# Patient Record
Sex: Female | Born: 1990
Health system: Southern US, Community
[De-identification: ages and names within clinical notes are randomized; demographics above are authoritative.]

## PROBLEM LIST (undated history)

## (undated) DIAGNOSIS — B009 Herpesviral infection, unspecified: Secondary | ICD-10-CM

## (undated) DIAGNOSIS — J45909 Unspecified asthma, uncomplicated: Secondary | ICD-10-CM

## (undated) DIAGNOSIS — E669 Obesity, unspecified: Secondary | ICD-10-CM

## (undated) HISTORY — DX: Herpesviral infection, unspecified: B00.9

## (undated) HISTORY — PX: WISDOM TOOTH EXTRACTION: SHX21

---
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
## 2009-06-05 ENCOUNTER — Emergency Department (HOSPITAL_COMMUNITY): Admission: EM | Admit: 2009-06-05 | Discharge: 2009-06-05 | Payer: Self-pay | Admitting: Family Medicine

## 2010-01-11 ENCOUNTER — Emergency Department (HOSPITAL_COMMUNITY): Admission: EM | Admit: 2010-01-11 | Discharge: 2010-01-11 | Payer: Self-pay | Admitting: Emergency Medicine

## 2010-04-01 ENCOUNTER — Emergency Department (HOSPITAL_COMMUNITY): Admission: EM | Admit: 2010-04-01 | Discharge: 2010-04-01 | Payer: Self-pay | Admitting: Emergency Medicine

## 2010-04-07 ENCOUNTER — Inpatient Hospital Stay (HOSPITAL_COMMUNITY): Admission: AD | Admit: 2010-04-07 | Discharge: 2010-04-07 | Payer: Self-pay | Admitting: Obstetrics and Gynecology

## 2010-04-07 ENCOUNTER — Ambulatory Visit: Payer: Self-pay | Admitting: Obstetrics and Gynecology

## 2010-04-16 ENCOUNTER — Emergency Department (HOSPITAL_COMMUNITY): Admission: EM | Admit: 2010-04-16 | Discharge: 2010-04-17 | Payer: Self-pay | Admitting: Emergency Medicine

## 2010-09-23 LAB — URINALYSIS, ROUTINE W REFLEX MICROSCOPIC
Hgb urine dipstick: NEGATIVE
Nitrite: NEGATIVE
Specific Gravity, Urine: 1.027 (ref 1.005–1.030)
Urobilinogen, UA: 1 mg/dL (ref 0.0–1.0)
pH: 8 (ref 5.0–8.0)

## 2010-09-23 LAB — POCT PREGNANCY, URINE
Preg Test, Ur: NEGATIVE
Preg Test, Ur: NEGATIVE

## 2010-09-23 LAB — BASIC METABOLIC PANEL
BUN: 16 mg/dL (ref 6–23)
CO2: 29 mEq/L (ref 19–32)
Calcium: 8.9 mg/dL (ref 8.4–10.5)
Chloride: 105 mEq/L (ref 96–112)
Creatinine, Ser: 0.64 mg/dL (ref 0.4–1.2)
Glucose, Bld: 103 mg/dL — ABNORMAL HIGH (ref 70–99)

## 2010-09-23 LAB — CBC
MCH: 27.8 pg (ref 26.0–34.0)
MCHC: 32.7 g/dL (ref 30.0–36.0)
MCV: 85.1 fL (ref 78.0–100.0)
Platelets: 238 10*3/uL (ref 150–400)
RBC: 4.25 MIL/uL (ref 3.87–5.11)
RDW: 15.3 % (ref 11.5–15.5)

## 2010-09-23 LAB — DIFFERENTIAL
Basophils Relative: 0 % (ref 0–1)
Eosinophils Absolute: 0.1 10*3/uL (ref 0.0–0.7)
Eosinophils Relative: 1 % (ref 0–5)
Lymphs Abs: 1.8 10*3/uL (ref 0.7–4.0)
Neutrophils Relative %: 71 % (ref 43–77)

## 2010-09-23 LAB — WET PREP, GENITAL: WBC, Wet Prep HPF POC: NONE SEEN

## 2010-09-23 LAB — POCT I-STAT, CHEM 8
Calcium, Ion: 1.25 mmol/L (ref 1.12–1.32)
Creatinine, Ser: 0.7 mg/dL (ref 0.4–1.2)
Glucose, Bld: 95 mg/dL (ref 70–99)
Hemoglobin: 12.2 g/dL (ref 12.0–15.0)
Potassium: 4.1 mEq/L (ref 3.5–5.1)

## 2010-09-23 LAB — GC/CHLAMYDIA PROBE AMP, GENITAL: GC Probe Amp, Genital: NEGATIVE

## 2011-01-03 ENCOUNTER — Inpatient Hospital Stay (HOSPITAL_COMMUNITY): Payer: PRIVATE HEALTH INSURANCE

## 2011-01-03 ENCOUNTER — Inpatient Hospital Stay (HOSPITAL_COMMUNITY)
Admission: AD | Admit: 2011-01-03 | Discharge: 2011-01-03 | Disposition: A | Discharge status: Home / Self Care | Payer: PRIVATE HEALTH INSURANCE | Source: Ambulatory Visit | Attending: Obstetrics & Gynecology | Admitting: Obstetrics & Gynecology

## 2011-01-03 DIAGNOSIS — R109 Unspecified abdominal pain: Secondary | ICD-10-CM

## 2011-01-03 DIAGNOSIS — N949 Unspecified condition associated with female genital organs and menstrual cycle: Secondary | ICD-10-CM | POA: Insufficient documentation

## 2011-01-03 LAB — CBC
MCH: 26.9 pg (ref 26.0–34.0)
MCV: 83.5 fL (ref 78.0–100.0)
Platelets: 197 10*3/uL (ref 150–400)
RDW: 14 % (ref 11.5–15.5)

## 2011-07-09 ENCOUNTER — Ambulatory Visit (INDEPENDENT_AMBULATORY_CARE_PROVIDER_SITE_OTHER): Payer: PRIVATE HEALTH INSURANCE

## 2011-07-09 DIAGNOSIS — R197 Diarrhea, unspecified: Secondary | ICD-10-CM

## 2011-07-09 DIAGNOSIS — R112 Nausea with vomiting, unspecified: Secondary | ICD-10-CM

## 2011-10-09 ENCOUNTER — Emergency Department (HOSPITAL_COMMUNITY)
Admission: EM | Admit: 2011-10-09 | Discharge: 2011-10-09 | Disposition: A | Discharge status: Home / Self Care | Payer: PRIVATE HEALTH INSURANCE | Attending: Emergency Medicine | Admitting: Emergency Medicine

## 2011-10-09 ENCOUNTER — Emergency Department (HOSPITAL_COMMUNITY): Payer: PRIVATE HEALTH INSURANCE

## 2011-10-09 ENCOUNTER — Encounter (HOSPITAL_COMMUNITY): Payer: Self-pay | Admitting: Emergency Medicine

## 2011-10-09 DIAGNOSIS — J40 Bronchitis, not specified as acute or chronic: Secondary | ICD-10-CM

## 2011-10-09 DIAGNOSIS — F17200 Nicotine dependence, unspecified, uncomplicated: Secondary | ICD-10-CM | POA: Insufficient documentation

## 2011-10-09 MED ORDER — ALBUTEROL SULFATE HFA 108 (90 BASE) MCG/ACT IN AERS
1.0000 | INHALATION_SPRAY | Freq: Once | RESPIRATORY_TRACT | Status: AC
  Administered 2011-10-09: 1 via RESPIRATORY_TRACT
  Filled 2011-10-09: qty 6.7

## 2011-10-09 MED ORDER — ALBUTEROL SULFATE (5 MG/ML) 0.5% IN NEBU
5.0000 mg | INHALATION_SOLUTION | Freq: Once | RESPIRATORY_TRACT | Status: AC
  Administered 2011-10-09: 5 mg via RESPIRATORY_TRACT
  Filled 2011-10-09: qty 1

## 2011-10-09 MED ORDER — ALBUTEROL SULFATE (5 MG/ML) 0.5% IN NEBU
5.0000 mg | INHALATION_SOLUTION | Freq: Once | RESPIRATORY_TRACT | Status: AC
  Administered 2011-10-09: 5 mg via RESPIRATORY_TRACT
  Filled 2011-10-09 (×2): qty 0.5

## 2011-10-09 MED ORDER — HYDROCOD POLST-CHLORPHEN POLST 10-8 MG/5ML PO LQCR: 5.0000 mL | Freq: Two times a day (BID) | ORAL | Status: DC | PRN

## 2011-10-09 MED ORDER — IPRATROPIUM BROMIDE 0.02 % IN SOLN
0.5000 mg | Freq: Once | RESPIRATORY_TRACT | Status: AC
  Administered 2011-10-09: 0.5 mg via RESPIRATORY_TRACT
  Filled 2011-10-09: qty 2.5

## 2011-10-09 NOTE — ED Provider Notes (Signed)
History     CSN: [id number]  Arrival date & time 10/09/11  8295   First MD Initiated Contact with Patient 10/09/11 1945      Chief Complaint  Patient presents with  . Cough    (Consider location/radiation/quality/duration/timing/severity/associated sxs/prior treatment) HPI History provided by pt.   Pt has had a non-productive cough for the past month.  Became more frequent over the past week and now associated w/ diffuse, constant CP that is aggravated by coughing.  Occasionally has SOB and had one episode of vomiting following coughing fits.  Denies fever but has had nasal congestion and pain in throat when coughs.  Has been taking OTC anti-tussives w/out relief.  Smokes 3 cigarettes a day.  No RF for PE.    History reviewed. No pertinent past medical history.  History reviewed. No pertinent past surgical history.  History reviewed. No pertinent family history.  History  Substance Use Topics  . Smoking status: Current Some Day Smoker  . Smokeless tobacco: Not on file  . Alcohol Use: Yes     occasionally    OB History    Grav Para Term Preterm Abortions TAB SAB Ect Mult Living                  Review of Systems  All other systems reviewed and are negative.    Allergies  Review of patient's allergies indicates no known allergies.  Home Medications  No current outpatient prescriptions on file.  BP 131/92  Pulse 100  Temp(Src) 98.8 F (37.1 C) (Oral)  Resp 20  Wt 307 lb (139.254 kg)  SpO2 100%  LMP 09/20/2011  Physical Exam  Nursing note and vitals reviewed. Constitutional: She is oriented to person, place, and time. She appears well-developed and well-nourished. No distress.  HENT:  Head: Normocephalic and atraumatic.  Eyes:       Normal appearance  Neck: Normal range of motion.  Cardiovascular: Normal rate, regular rhythm and intact distal pulses.   Pulmonary/Chest: Effort normal and breath sounds normal. No respiratory distress.       coughing    Abdominal: Soft. Bowel sounds are normal. She exhibits no distension. There is no tenderness. There is no guarding.  Musculoskeletal:       No peripheral edema or calf tenderness  Neurological: She is alert and oriented to person, place, and time.  Skin: Skin is warm and dry. No rash noted.  Psychiatric: She has a normal mood and affect. Her behavior is normal.    ED Course  Procedures (including critical care time)  Labs Reviewed - No data to display Dg Chest 2 View  10/09/2011  *RADIOLOGY REPORT*  Clinical Data: Chest pain.  Short of breath.  Cough.  CHEST - 2 VIEW  Comparison: None.  Findings: Mild elevation of the left hemidiaphragm. Cardiopericardial silhouette within normal limits. Mediastinal contours normal. Trachea midline.  No airspace disease or effusion.  IMPRESSION: No acute cardiopulmonary disease.  Original Report Authenticated By: Andreas Newport, M.D.     1. Bronchitis       MDM  Healthy 21yo F presents w/ cough x 1 month that has worsened over past week.   On exam, afebrile, no respiratory distress, coughing, lungs clear.  Pt received a breathing treatment w/ subjective improvement of sx but feels as though they are worsening again.  Will try one more.  CXR neg for pneumonia.  Pt d/c'd home w/ albutero inhaler and tussionex for symptomatic tmnt of bronchitis.  Recommended PCP  f/u if no improvement by end of week and cutting back on cigarettes.  Return precautions discussed.         Arie Sabina [place], [place] 10/09/11 2103

## 2011-10-09 NOTE — ED Notes (Signed)
Patient transported to X-ray 

## 2011-10-09 NOTE — Discharge Instructions (Signed)
Take tussionex at night for cough.  Do not drive within four hours of taking this medication (may cause drowsiness or confusion).  Use albuterol inhaler, 2 puffs every 4 hours, as needed for cough and shortness of breath.   Follow up with your primary care doctor if your symptoms have not started to improve by the end of the week.  Try to cut back on smoking because this may prolong your symptoms.  You should return to the ER if you develop difficulty breathing.

## 2011-10-09 NOTE — ED Notes (Signed)
Pt c/o cough x 1 month, has seen PCP who told her she had a cold.  Pt has been taking OTC cough and cold medications w/o relief.

## 2011-10-09 NOTE — ED Provider Notes (Signed)
Medical screening examination/treatment/procedure(s) were performed by non-physician practitioner and as supervising physician I was immediately available for consultation/collaboration.   Dayton Bailiff, MD 10/09/11 2320

## 2011-12-21 ENCOUNTER — Emergency Department (HOSPITAL_COMMUNITY)
Admission: EM | Admit: 2011-12-21 | Discharge: 2011-12-21 | Disposition: A | Discharge status: Home / Self Care | Payer: PRIVATE HEALTH INSURANCE | Attending: Emergency Medicine | Admitting: Emergency Medicine

## 2011-12-21 ENCOUNTER — Encounter (HOSPITAL_COMMUNITY): Payer: Self-pay | Admitting: *Deleted

## 2011-12-21 DIAGNOSIS — J209 Acute bronchitis, unspecified: Secondary | ICD-10-CM | POA: Insufficient documentation

## 2011-12-21 DIAGNOSIS — F17200 Nicotine dependence, unspecified, uncomplicated: Secondary | ICD-10-CM | POA: Insufficient documentation

## 2011-12-21 HISTORY — DX: Unspecified asthma, uncomplicated: J45.909

## 2011-12-21 MED ORDER — HYDROCODONE-ACETAMINOPHEN 7.5-500 MG/15ML PO SOLN: 10.0000 mL | Freq: Four times a day (QID) | ORAL | Status: AC | PRN

## 2011-12-21 MED ORDER — AZITHROMYCIN 500 MG PO TABS: 500.0000 mg | ORAL_TABLET | Freq: Every day | ORAL | Status: AC

## 2011-12-21 NOTE — Discharge Instructions (Signed)
Continue to use your inhaler and motrin. Rest and push fluids. Return for worsening of symptoms  Acute Bronchitis You have acute bronchitis. This means you have a chest cold. The airways in your lungs are red and sore (inflamed). Acute means it is sudden onset.  CAUSES Bronchitis is most often caused by the same virus that causes a cold. SYMPTOMS   Body aches.   Chest congestion.   Chills.   Cough.   Fever.   Shortness of breath.   Sore throat.  TREATMENT  Acute bronchitis is usually treated with rest, fluids, and medicines for relief of fever or cough. Most symptoms should go away after a few days or a week. Increased fluids may help thin your secretions and will prevent dehydration. Your caregiver may give you an inhaler to improve your symptoms. The inhaler reduces shortness of breath and helps control cough. You can take over-the-counter pain relievers or cough medicine to decrease coughing, pain, or fever. A cool-air vaporizer may help thin bronchial secretions and make it easier to clear your chest. Antibiotics are usually not needed but can be prescribed if you smoke, are seriously ill, have chronic lung problems, are elderly, or you are at higher risk for developing complications.Allergies and asthma can make bronchitis worse. Repeated episodes of bronchitis may cause longstanding lung problems. Avoid smoking and secondhand smoke.Exposure to cigarette smoke or irritating chemicals will make bronchitis worse. If you are a cigarette smoker, consider using nicotine gum or skin patches to help control withdrawal symptoms. Quitting smoking will help your lungs heal faster. Recovery from bronchitis is often slow, but you should start feeling better after 2 to 3 days. Cough from bronchitis frequently lasts for 3 to 4 weeks. To prevent another bout of acute bronchitis:  Quit smoking.   Wash your hands frequently to get rid of viruses or use a hand sanitizer.   Avoid other people  with cold or virus symptoms.   Try not to touch your hands to your mouth, nose, or eyes.  SEEK IMMEDIATE MEDICAL CARE IF:  You develop increased fever, chills, or chest pain.   You have severe shortness of breath or bloody sputum.   You develop dehydration, fainting, repeated vomiting, or a severe headache.   You have no improvement after 1 week of treatment or you get worse.  MAKE SURE YOU:   Understand these instructions.   Will watch your condition.   Will get help right away if you are not doing well or get worse.  Document Released: 08/04/2004 Document Revised: 06/16/2011 Document Reviewed: 10/20/2010 [facility] Patient Information 2012 [place], [place].

## 2011-12-21 NOTE — ED Notes (Signed)
Pt presents with cough, reproducible chest discomfort with cough and movement, states when she does bring up secretions they sometimes have blood tinged color.No history of any cardiac problems, constantly coughing in triage, dry cough

## 2011-12-21 NOTE — ED Provider Notes (Signed)
Medical screening examination/treatment/procedure(s) were performed by non-physician practitioner and as supervising physician I was immediately available for consultation/collaboration.   Lyanne Co, MD 12/21/11 1122

## 2011-12-21 NOTE — ED Provider Notes (Signed)
History     CSN: [id number]  Arrival date & time 12/21/11  1030   First MD Initiated Contact with Patient 12/21/11 1044      Chief Complaint  Patient presents with  . Cough    (Consider location/radiation/quality/duration/timing/severity/associated sxs/prior treatment) Patient is a 21 y.o. female presenting with cough. The history is provided by the patient.  Cough The current episode started 2 days ago. The problem occurs constantly. The problem has not changed since onset.There has been no fever. Pertinent negatives include no rhinorrhea and no shortness of breath. Associated symptoms comments: 21 y/o female INAD c/o occasionally productive cough x 2-3 days with back back exacerbated by cough. Denies fever, SOB  Or GI complaints. Pt has noticed a scant amount of blood tinged sputum. She has tried nothing for the symptoms.    Past Medical History  Diagnosis Date  . Asthma     History reviewed. No pertinent past surgical history.  No family history on file.  History  Substance Use Topics  . Smoking status: Current Some Day Smoker  . Smokeless tobacco: Not on file  . Alcohol Use: Yes     occasionally    OB History    Grav Para Term Preterm Abortions TAB SAB Ect Mult Living                  Review of Systems  Constitutional: Negative.  Negative for fever.  HENT: Negative for congestion and rhinorrhea.   Respiratory: Positive for cough. Negative for chest tightness and shortness of breath.   Cardiovascular: Negative.   Gastrointestinal: Negative.   Musculoskeletal: Positive for back pain.  Skin: Negative.     Allergies  Review of patient's allergies indicates no known allergies.  Home Medications   Current Outpatient Rx  Name Route Sig Dispense Refill  . HYDROCOD POLST-CPM POLST ER 10-8 MG/5ML PO LQCR Oral Take 5 mLs by mouth every 12 (twelve) hours as needed. 60 mL 0    BP 141/63  Pulse 83  Temp 98.2 F (36.8 C)  Resp 22  SpO2 97%  LMP  12/14/2011  Physical Exam  Constitutional: She is oriented to person, place, and time. She appears well-developed and well-nourished. No distress.  HENT:  Head: Normocephalic and atraumatic.  Eyes: Conjunctivae and EOM are normal.  Cardiovascular: Normal rate.   Pulmonary/Chest: Effort normal and breath sounds normal. No respiratory distress. She has no wheezes. She has no rales. She exhibits no tenderness.  Musculoskeletal: Normal range of motion.  Neurological: She is alert and oriented to person, place, and time.  Psychiatric: She has a normal mood and affect.    ED Course  Procedures (including critical care time)  Labs Reviewed - No data to display No results found.   No diagnosis found.  Acute bronchitis  MDM  21 y/o female iNAD c/o intermittently productive cough. Will Tx with azithromycin, cough suppressants and motrin for myalgia exacerbated by cough        Wynetta Emery, PA-C 12/21/11 1102

## 2012-07-24 ENCOUNTER — Encounter (HOSPITAL_COMMUNITY): Payer: Self-pay | Admitting: *Deleted

## 2012-07-24 ENCOUNTER — Emergency Department (HOSPITAL_COMMUNITY)
Admission: EM | Admit: 2012-07-24 | Discharge: 2012-07-24 | Disposition: A | Discharge status: Home / Self Care | Payer: Self-pay | Attending: Emergency Medicine | Admitting: Emergency Medicine

## 2012-07-24 DIAGNOSIS — R52 Pain, unspecified: Secondary | ICD-10-CM | POA: Insufficient documentation

## 2012-07-24 DIAGNOSIS — J45909 Unspecified asthma, uncomplicated: Secondary | ICD-10-CM | POA: Insufficient documentation

## 2012-07-24 DIAGNOSIS — F17200 Nicotine dependence, unspecified, uncomplicated: Secondary | ICD-10-CM | POA: Insufficient documentation

## 2012-07-24 DIAGNOSIS — J029 Acute pharyngitis, unspecified: Secondary | ICD-10-CM | POA: Insufficient documentation

## 2012-07-24 DIAGNOSIS — Z79899 Other long term (current) drug therapy: Secondary | ICD-10-CM | POA: Insufficient documentation

## 2012-07-24 DIAGNOSIS — M542 Cervicalgia: Secondary | ICD-10-CM | POA: Insufficient documentation

## 2012-07-24 DIAGNOSIS — R51 Headache: Secondary | ICD-10-CM | POA: Insufficient documentation

## 2012-07-24 DIAGNOSIS — R197 Diarrhea, unspecified: Secondary | ICD-10-CM | POA: Insufficient documentation

## 2012-07-24 DIAGNOSIS — R6889 Other general symptoms and signs: Secondary | ICD-10-CM

## 2012-07-24 DIAGNOSIS — R509 Fever, unspecified: Secondary | ICD-10-CM | POA: Insufficient documentation

## 2012-07-24 LAB — CBC WITH DIFFERENTIAL/PLATELET
Basophils Absolute: 0 10*3/uL (ref 0.0–0.1)
Basophils Relative: 0 % (ref 0–1)
MCHC: 32.4 g/dL (ref 30.0–36.0)
Monocytes Absolute: 1.2 10*3/uL — ABNORMAL HIGH (ref 0.1–1.0)
Neutro Abs: 5.7 10*3/uL (ref 1.7–7.7)
Neutrophils Relative %: 63 % (ref 43–77)
Platelets: 226 10*3/uL (ref 150–400)
RDW: 13.7 % (ref 11.5–15.5)

## 2012-07-24 LAB — COMPREHENSIVE METABOLIC PANEL
ALT: 12 U/L (ref 0–35)
AST: 30 U/L (ref 0–37)
CO2: 26 mEq/L (ref 19–32)
Calcium: 8.8 mg/dL (ref 8.4–10.5)
Creatinine, Ser: 0.6 mg/dL (ref 0.50–1.10)
GFR calc Af Amer: >90 mL/min (ref >90)
GFR calc non Af Amer: >90 mL/min (ref >90)
Sodium: 136 mEq/L (ref 135–145)
Total Protein: 7.9 g/dL (ref 6.0–8.3)

## 2012-07-24 LAB — URINALYSIS, ROUTINE W REFLEX MICROSCOPIC
Glucose, UA: NEGATIVE mg/dL
Leukocytes, UA: NEGATIVE
Protein, ur: NEGATIVE mg/dL
Urobilinogen, UA: 0.2 mg/dL (ref 0.0–1.0)

## 2012-07-24 MED ORDER — OSELTAMIVIR PHOSPHATE 75 MG PO CAPS: 75.0000 mg | ORAL_CAPSULE | Freq: Two times a day (BID) | ORAL | Status: DC

## 2012-07-24 MED ORDER — PROMETHAZINE HCL 25 MG/ML IJ SOLN
25.0000 mg | Freq: Once | INTRAMUSCULAR | Status: DC
  Filled 2012-07-24: qty 1

## 2012-07-24 MED ORDER — OXYCODONE-ACETAMINOPHEN 5-325 MG PO TABS
2.0000 | ORAL_TABLET | Freq: Once | ORAL | Status: AC
  Administered 2012-07-24: 2 via ORAL
  Filled 2012-07-24: qty 2

## 2012-07-24 MED ORDER — SODIUM CHLORIDE 0.9 % IV BOLUS (SEPSIS)
1000.0000 mL | Freq: Once | INTRAVENOUS | Status: AC
  Administered 2012-07-24: 1000 mL via INTRAVENOUS

## 2012-07-24 MED ORDER — HYDROCODONE-ACETAMINOPHEN 5-325 MG PO TABS: 2.0000 | ORAL_TABLET | Freq: Four times a day (QID) | ORAL | Status: DC | PRN

## 2012-07-24 MED ORDER — SODIUM CHLORIDE 0.9 % IV BOLUS (SEPSIS): 1000.0000 mL | Freq: Once | INTRAVENOUS | Status: DC

## 2012-07-24 MED ORDER — KETOROLAC TROMETHAMINE 30 MG/ML IJ SOLN
30.0000 mg | Freq: Once | INTRAMUSCULAR | Status: AC
  Administered 2012-07-24: 30 mg via INTRAVENOUS
  Filled 2012-07-24: qty 1

## 2012-07-24 MED ORDER — HYDROCODONE-ACETAMINOPHEN 5-325 MG PO TABS
1.0000 | ORAL_TABLET | Freq: Once | ORAL | Status: AC
  Administered 2012-07-24: 1 via ORAL
  Filled 2012-07-24: qty 1

## 2012-07-24 MED ORDER — PROMETHAZINE HCL 25 MG PO TABS
25.0000 mg | ORAL_TABLET | Freq: Once | ORAL | Status: AC
  Administered 2012-07-24: 25 mg via ORAL
  Filled 2012-07-24: qty 1

## 2012-07-24 NOTE — ED Provider Notes (Signed)
Patient seen, evaluated and treated in conjunction with the PA. Patient complained of headache and flulike symptoms. He was felt that this was likely a viral syndrome, but meningitis could not be ruled out. She did not have any meningismus, but in point of persistent headache worsening with moving her head. I have like a conversation with the patient about the possibility of meningitis and the need to rule out. She did consent to lumbar puncture procedure. Lumbar puncture was attempted, but patient did not tolerate the procedure because of pain. 2 attempts were made and the patient insisted that the procedure be terminated. I had a conversation with the patient and her mother about the possibility of meningitis and the danger of meningitis including protozoa and death. I also discussed possibility of lumbar puncture under fluoroscopy. Patient does not wish to pursue this at this time. She was therefore discharged and treated for influenza, given instructions to return for high fever, worsening headache, passing out or seizure.  Procedure note: Lumbar puncture Patient was placed in the sitting position. Back was cleaned with Betadine and sterile prep and drapes were applied. One percent lidocaine was utilized to infiltrate the area at the L4-L5 disc space. A 20-gauge spinal needle was then introduced through the anesthetized skin and advanced towards the disc space. I difficulty finding the intrer-disc space and took the needle out 2 times then reintroduced in attempt to find the inter-disc space. These attempts were unsuccessful. Patient then asked for the procedure to be terminated because she was uncomfortable. No CSF was obtained.  Gilda Crease, MD 07/24/12 2142

## 2012-07-24 NOTE — ED Notes (Signed)
MD at bedside. 

## 2012-07-24 NOTE — ED Provider Notes (Signed)
MSE was initiated and I personally evaluated the patient and placed orders (if any) at  3:58 PM on July 24, 2012.  22 year old female presents the emergency department complaining of fever, body aches, severe headache and neck pain beginning on Sunday. States anytime she moves her neck she gets severe headache and pain. Also complaining of cough and sore throat. Tmax 101.3 on Sunday. Patient had no tenderness to palpation of her neck, however she does have positive meningeal signs. Denies rash. She is in college but does not live in the dorms. I have slight suspicion for meningitis.  The patient appears stable so that the remainder of the MSE may be completed by another provider.  Trevor Mace, PA-C 07/24/12 [phone]

## 2012-07-24 NOTE — ED Notes (Addendum)
IV team attempted pt IV stick, was able to obtain blood for blood draw, however pt complained of pain when IV catheter was flushed.  IV nurse stated that in order to place IV in foot, an order would be needed from the Dr.  I have obtained a verbal order from [place], [place] to start IV in pts foot.  IV team notified regarding recent order and is en route.

## 2012-07-24 NOTE — ED Provider Notes (Signed)
Medical screening examination/treatment/procedure(s) were performed by non-physician practitioner and as supervising physician I was immediately available for consultation/collaboration.   Salli Bodin L Lenvil Swaim, MD 07/24/12 2348 

## 2012-07-24 NOTE — ED Notes (Signed)
PA @ bedside.

## 2012-07-24 NOTE — ED Provider Notes (Signed)
History     CSN: [id number]  Arrival date & time 07/24/12  1317   First MD Initiated Contact with Patient 07/24/12 1547      Chief Complaint  Patient presents with  . Generalized Body Aches  . Neck Pain  . Sore Throat  . Fever    (Consider location/radiation/quality/duration/timing/severity/associated sxs/prior treatment) HPI Comments: This is a 22 year old female, who presents to the emergency department with chief complaint of flulike symptoms. Patient states the symptoms began on Sunday. Is having fever, body aches, diarrhea, headache, and sore throat. She states that she measured a temperature of 101.3. She is afebrile at this time. She's been taking ibuprofen with some relief. Patient did not have a flu shot. She denies chest pain, shortness of breath, cough, nausea, vomiting, constipation, numbness and tingling of the extremities. Patient states that her symptoms have been persistent, and severe.  The history is provided by the patient. No language interpreter was used.    Past Medical History  Diagnosis Date  . Asthma     Past Surgical History  Procedure Date  . Wisdom tooth extraction     History reviewed. No pertinent family history.  History  Substance Use Topics  . Smoking status: Current Some Day Smoker  . Smokeless tobacco: Never Used  . Alcohol Use: Yes     Comment: occasionally    OB History    Grav Para Term Preterm Abortions TAB SAB Ect Mult Living                  Review of Systems  All other systems reviewed and are negative.    Allergies  Review of patient's allergies indicates no known allergies.  Home Medications   Current Outpatient Rx  Name  Route  Sig  Dispense  Refill  . DIPHENHYDRAMINE-APAP (SLEEP) 25-500 MG PO TABS   Oral   Take 1 tablet by mouth at bedtime as needed. Fever         . IBUPROFEN 200 MG PO TABS   Oral   Take 200 mg by mouth every 6 (six) hours as needed. Pain           BP 143/90  Pulse 83  Temp 98.7  F (37.1 C) (Oral)  Resp 18  SpO2 100%  LMP 06/27/2012  Physical Exam  Nursing note and vitals reviewed. Constitutional: She is oriented to person, place, and time. She appears well-developed and well-nourished.       Obese  HENT:  Head: Normocephalic and atraumatic.  Eyes: Conjunctivae normal and EOM are normal. Pupils are equal, round, and reactive to light.  Neck: Normal range of motion. Neck supple.       Tenderness to palpation of the occipital muscles, and associated tenderness with neck flexion, no signs of meningismus in the neck proper.  Cardiovascular: Normal rate and regular rhythm.  Exam reveals no gallop and no friction rub.   No murmur heard. Pulmonary/Chest: Effort normal and breath sounds normal. No respiratory distress. She has no wheezes. She has no rales. She exhibits no tenderness.  Abdominal: Soft. Bowel sounds are normal. She exhibits no distension and no mass. There is no tenderness. There is no rebound and no guarding.  Musculoskeletal: Normal range of motion. She exhibits no edema and no tenderness.       Negative Kernig's sign.  Neurological: She is alert and oriented to person, place, and time.  Skin: Skin is warm and dry.  Psychiatric: She has a normal mood and  affect. Her behavior is normal. Judgment and thought content normal.    ED Course  Procedures (including critical care time)   Labs Reviewed  CBC WITH DIFFERENTIAL  URINALYSIS, ROUTINE W REFLEX MICROSCOPIC  COMPREHENSIVE METABOLIC PANEL   Results for orders placed during the hospital encounter of 07/24/12  CBC WITH DIFFERENTIAL      Component Value Range   WBC 9.1  4.0 - 10.5 K/uL   RBC 4.42  3.87 - 5.11 MIL/uL   Hemoglobin 12.1  12.0 - 15.0 g/dL   HCT 16.1  09.6 - 04.5 %   MCV 84.6  78.0 - 100.0 fL   MCH 27.4  26.0 - 34.0 pg   MCHC 32.4  30.0 - 36.0 g/dL   RDW 40.9  81.1 - 91.4 %   Platelets 226  150 - 400 K/uL   Neutrophils Relative 63  43 - 77 %   Neutro Abs 5.7  1.7 - 7.7 K/uL    Lymphocytes Relative 22  12 - 46 %   Lymphs Abs 2.0  0.7 - 4.0 K/uL   Monocytes Relative 13 (*) 3 - 12 %   Monocytes Absolute 1.2 (*) 0.1 - 1.0 K/uL   Eosinophils Relative 2  0 - 5 %   Eosinophils Absolute 0.2  0.0 - 0.7 K/uL   Basophils Relative 0  0 - 1 %   Basophils Absolute 0.0  0.0 - 0.1 K/uL  URINALYSIS, ROUTINE W REFLEX MICROSCOPIC      Component Value Range   Color, Urine YELLOW  YELLOW   APPearance CLEAR  CLEAR   Specific Gravity, Urine 1.026  1.005 - 1.030   pH 6.5  5.0 - 8.0   Glucose, UA NEGATIVE  NEGATIVE mg/dL   Hgb urine dipstick NEGATIVE  NEGATIVE   Bilirubin Urine NEGATIVE  NEGATIVE   Ketones, ur TRACE (*) NEGATIVE mg/dL   Protein, ur NEGATIVE  NEGATIVE mg/dL   Urobilinogen, UA 0.2  0.0 - 1.0 mg/dL   Nitrite NEGATIVE  NEGATIVE   Leukocytes, UA NEGATIVE  NEGATIVE  COMPREHENSIVE METABOLIC PANEL      Component Value Range   Sodium 136  135 - 145 mEq/L   Potassium 4.4  3.5 - 5.1 mEq/L   Chloride 99  96 - 112 mEq/L   CO2 26  19 - 32 mEq/L   Glucose, Bld 77  70 - 99 mg/dL   BUN 6  6 - 23 mg/dL   Creatinine, Ser 7.82  0.50 - 1.10 mg/dL   Calcium 8.8  8.4 - 95.6 mg/dL   Total Protein 7.9  6.0 - 8.3 g/dL   Albumin 3.5  3.5 - 5.2 g/dL   AST 30  0 - 37 U/L   ALT 12  0 - 35 U/L   Alkaline Phosphatase 53  39 - 117 U/L   Total Bilirubin 0.1 (*) 0.3 - 1.2 mg/dL   GFR calc non Af Amer >90  >90 mL/min   GFR calc Af Amer >90  >90 mL/min       1. Flu-like symptoms       MDM  22 year old female with flu-like symptoms.  I suspect that this patient has the flu, however, she has been seen by Dr. Blinda Leatherwood, as I was slightly suspicious for meningitis.  Will obtain routine labs and give fluids.  Serial exams.  8:46 PM Lumbar Puncture performed by Dr. Blinda Leatherwood, but was unsuccessful 2/2 patient being unwilling to continue the procedure.  The risks of not  having the procedure completed were thoroughly explained to the patient.  She states that she wants to be treated for the  flu, and will return for worsening headache, fevers, or seizures.  It was explained that we would not be able to fully rule out meningitis, due to unsuccessful LP.  The patient understands the risks and agrees to follow-up if needed.  Currently, she is stable and ready for discharge.  I will treat the patient with Tamiflu, and have the patient follow-up if needed.  She understands and agrees with the plan.        Roxy Horseman, PA-C 07/24/12 2051

## 2012-07-24 NOTE — ED Notes (Signed)
Pt states Sunday started having a fever 101.3, body aches, headache, states took ibuprofen, then today started having neck pain, sore throat along with still having other symptoms.

## 2012-07-26 NOTE — ED Provider Notes (Signed)
Medical screening examination/treatment/procedure(s) were performed by non-physician practitioner and as supervising physician I was immediately available for consultation/collaboration.  Toy Baker, MD 07/26/12 [phone]

## 2013-07-06 ENCOUNTER — Emergency Department (HOSPITAL_COMMUNITY)
Admission: EM | Admit: 2013-07-06 | Discharge: 2013-07-06 | Disposition: A | Discharge status: Home / Self Care | Payer: No Typology Code available for payment source | Attending: Emergency Medicine | Admitting: Emergency Medicine

## 2013-07-06 ENCOUNTER — Encounter (HOSPITAL_COMMUNITY): Payer: Self-pay | Admitting: Emergency Medicine

## 2013-07-06 DIAGNOSIS — J3489 Other specified disorders of nose and nasal sinuses: Secondary | ICD-10-CM | POA: Insufficient documentation

## 2013-07-06 DIAGNOSIS — M549 Dorsalgia, unspecified: Secondary | ICD-10-CM | POA: Insufficient documentation

## 2013-07-06 DIAGNOSIS — J45909 Unspecified asthma, uncomplicated: Secondary | ICD-10-CM | POA: Insufficient documentation

## 2013-07-06 DIAGNOSIS — R05 Cough: Secondary | ICD-10-CM | POA: Insufficient documentation

## 2013-07-06 DIAGNOSIS — B9789 Other viral agents as the cause of diseases classified elsewhere: Secondary | ICD-10-CM | POA: Insufficient documentation

## 2013-07-06 DIAGNOSIS — F17200 Nicotine dependence, unspecified, uncomplicated: Secondary | ICD-10-CM | POA: Insufficient documentation

## 2013-07-06 DIAGNOSIS — R110 Nausea: Secondary | ICD-10-CM | POA: Insufficient documentation

## 2013-07-06 DIAGNOSIS — J111 Influenza due to unidentified influenza virus with other respiratory manifestations: Secondary | ICD-10-CM

## 2013-07-06 DIAGNOSIS — R059 Cough, unspecified: Secondary | ICD-10-CM | POA: Insufficient documentation

## 2013-07-06 MED ORDER — HYDROCOD POLST-CHLORPHEN POLST 10-8 MG/5ML PO LQCR: 5.0000 mL | Freq: Two times a day (BID) | ORAL | Status: DC | PRN

## 2013-07-06 MED ORDER — BENZONATATE 100 MG PO CAPS: 100.0000 mg | ORAL_CAPSULE | Freq: Three times a day (TID) | ORAL | Status: DC

## 2013-07-06 NOTE — ED Provider Notes (Signed)
CSN: [id number]     Arrival date & time 07/06/13  1119 History  This chart was scribed for non-physician practitioner, Rhea Bleacher, PA-C working with Lyanne Co, MD by Greggory Stallion, ED scribe. This patient was seen in room WTR5/WTR5 and the patient's care was started at 12:53 PM.   Chief Complaint  Patient presents with  . Sore Throat  . Cough   The history is provided by the patient. No language interpreter was used.   HPI Comments: Eron Goble is a 22 y.o. female who presents to the Emergency Department complaining of sore throat, rhinorrhea, nausea and cough that started 4 days ago. States she has back pain due to cough. Pt states she had a fever and chills but it is resolved now. Unsure of how high it got. Pt has taken zinc, OTC chest congestion medication, robitussin, and 800 mg ibuprofen with no relief. Denies emesis, dysuria, rash. She has not gotten a flu shot this year.   Past Medical History  Diagnosis Date  . Asthma    Past Surgical History  Procedure Laterality Date  . Wisdom tooth extraction     No family history on file. History  Substance Use Topics  . Smoking status: Current Some Day Smoker  . Smokeless tobacco: Never Used  . Alcohol Use: Yes     Comment: occasionally   OB History   Grav Para Term Preterm Abortions TAB SAB Ect Mult Living                 Review of Systems  Constitutional: Negative for fever and chills.  HENT: Positive for rhinorrhea and sore throat.   Respiratory: Positive for cough.   Gastrointestinal: Positive for nausea. Negative for vomiting.  Genitourinary: Negative for dysuria.  Musculoskeletal: Positive for back pain.  Skin: Negative for rash.    Allergies  Review of patient's allergies indicates no known allergies.  Home Medications   Current Outpatient Rx  Name  Route  Sig  Dispense  Refill  . guaifenesin (HUMIBID E) 400 MG TABS tablet   Oral   Take 800 mg by mouth every 4 (four) hours as needed (cold).          Marland Kitchen guaiFENesin-dextromethorphan (ROBITUSSIN DM) 100-10 MG/5ML syrup   Oral   Take 10 mLs by mouth every 4 (four) hours as needed for cough.         Marland Kitchen ibuprofen (ADVIL,MOTRIN) 800 MG tablet   Oral   Take 800 mg by mouth every 8 (eight) hours as needed for mild pain or moderate pain.         . Zinc 50 MG TABS   Oral   Take 1 tablet by mouth daily.          BP 155/63  Pulse 69  Temp(Src) 98.7 F (37.1 C) (Oral)  Resp 16  SpO2 100%  LMP 06/16/2013  Physical Exam  Nursing note and vitals reviewed. Constitutional: She appears well-developed and well-nourished. No distress.  HENT:  Head: Normocephalic and atraumatic.  Right Ear: Tympanic membrane and ear canal normal.  Left Ear: Tympanic membrane and ear canal normal.  Nose: Nose normal.  Mouth/Throat: Posterior oropharyngeal erythema present.  Eyes: EOM are normal.  Neck: Neck supple. No tracheal deviation present.  Cardiovascular: Normal rate, regular rhythm and normal heart sounds.   No murmur heard. Pulmonary/Chest: Effort normal and breath sounds normal. No respiratory distress. She has no wheezes. She has no rales.  Musculoskeletal: Normal range of motion.  Lymphadenopathy:  She has no cervical adenopathy.  Neurological: She is alert.  Skin: Skin is warm and dry.  Psychiatric: She has a normal mood and affect. Her behavior is normal.    ED Course  Procedures (including critical care time)  DIAGNOSTIC STUDIES: Oxygen Saturation is 100% on RA, normal by my interpretation.    COORDINATION OF CARE: 12:58 PM-Discussed treatment plan which includes cough medication, tussionex and ibuprofen with pt at bedside and pt agreed to plan.   Labs Review Labs Reviewed - No data to display Imaging Review No results found.  EKG Interpretation   None       Vital signs reviewed and are as follows: Filed Vitals:   07/06/13 1139  BP: 155/63  Pulse: 69  Temp: 98.7 F (37.1 C)  Resp: 16   Patient discharged to  home. Encouraged to rest and drink plenty of fluids.  Patient told to return to ED or see their primary doctor if their symptoms worsen, high fever not controlled with tylenol, persistent vomiting, they feel they are dehydrated, or if they have any other concerns.  Patient verbalized understanding and agreed with plan.    Patient counseled on use of narcotic cough medications. Counseled not to combine these medications with others containing tylenol. Urged not to drink alcohol, drive, or perform any other activities that requires focus while taking these medications. The patient verbalizes understanding and agrees with the plan.    MDM   1. Influenza-like illness    Patient with symptoms consistent with influenza. She appears well, non-toxic. Has asthma but no wheezing. She is not candidate for tamiflu. Vitals are stable, no fever.  No signs of dehydration.  Lung exam normal, no signs of pneumonia.  Supportive therapy indicated with return if symptoms worsen.    I personally performed the services described in this documentation, which was scribed in my presence. The recorded information has been reviewed and is accurate.   Renne Crigler, PA-C 07/06/13 1335

## 2013-07-06 NOTE — ED Provider Notes (Signed)
Medical screening examination/treatment/procedure(s) were performed by non-physician practitioner and as supervising physician I was immediately available for consultation/collaboration.  EKG Interpretation   None         Michelle Pope M Syan Cullimore, MD 07/06/13 1603 

## 2013-07-06 NOTE — ED Notes (Signed)
Pt c/o sore throat and cough since Tuesday.  States she was having a fever.

## 2015-07-31 ENCOUNTER — Encounter (HOSPITAL_COMMUNITY): Payer: Self-pay | Admitting: *Deleted

## 2015-07-31 ENCOUNTER — Emergency Department (HOSPITAL_COMMUNITY)
Admission: EM | Admit: 2015-07-31 | Discharge: 2015-07-31 | Disposition: A | Discharge status: Home / Self Care | Payer: BLUE CROSS/BLUE SHIELD | Attending: Emergency Medicine | Admitting: Emergency Medicine

## 2015-07-31 DIAGNOSIS — Y9289 Other specified places as the place of occurrence of the external cause: Secondary | ICD-10-CM | POA: Insufficient documentation

## 2015-07-31 DIAGNOSIS — Y998 Other external cause status: Secondary | ICD-10-CM | POA: Insufficient documentation

## 2015-07-31 DIAGNOSIS — X58XXXA Exposure to other specified factors, initial encounter: Secondary | ICD-10-CM | POA: Diagnosis not present

## 2015-07-31 DIAGNOSIS — J45909 Unspecified asthma, uncomplicated: Secondary | ICD-10-CM | POA: Insufficient documentation

## 2015-07-31 DIAGNOSIS — S39012A Strain of muscle, fascia and tendon of lower back, initial encounter: Secondary | ICD-10-CM | POA: Diagnosis not present

## 2015-07-31 DIAGNOSIS — S3992XA Unspecified injury of lower back, initial encounter: Secondary | ICD-10-CM | POA: Diagnosis present

## 2015-07-31 DIAGNOSIS — Y9389 Activity, other specified: Secondary | ICD-10-CM | POA: Diagnosis not present

## 2015-07-31 DIAGNOSIS — M654 Radial styloid tenosynovitis [de Quervain]: Secondary | ICD-10-CM

## 2015-07-31 DIAGNOSIS — S6991XA Unspecified injury of right wrist, hand and finger(s), initial encounter: Secondary | ICD-10-CM | POA: Insufficient documentation

## 2015-07-31 DIAGNOSIS — F17210 Nicotine dependence, cigarettes, uncomplicated: Secondary | ICD-10-CM | POA: Insufficient documentation

## 2015-07-31 MED ORDER — PREDNISONE 20 MG PO TABS: 40.0000 mg | ORAL_TABLET | Freq: Every day | ORAL | Status: DC

## 2015-07-31 MED ORDER — NAPROXEN 500 MG PO TABS: 500.0000 mg | ORAL_TABLET | Freq: Two times a day (BID) | ORAL | Status: DC

## 2015-07-31 MED ORDER — KETOROLAC TROMETHAMINE 30 MG/ML IJ SOLN
30.0000 mg | Freq: Once | INTRAMUSCULAR | Status: AC
  Administered 2015-07-31: 30 mg via INTRAMUSCULAR
  Filled 2015-07-31: qty 1

## 2015-07-31 MED ORDER — ACETAMINOPHEN 325 MG PO TABS
650.0000 mg | ORAL_TABLET | Freq: Once | ORAL | Status: AC
  Administered 2015-07-31: 650 mg via ORAL
  Filled 2015-07-31: qty 2

## 2015-07-31 MED ORDER — PREDNISONE 20 MG PO TABS
40.0000 mg | ORAL_TABLET | Freq: Once | ORAL | Status: AC
  Administered 2015-07-31: 40 mg via ORAL
  Filled 2015-07-31: qty 2

## 2015-07-31 NOTE — ED Notes (Signed)
Pt reports R wrist/R thumb pain x 1 week and R lower back pain as well.  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  Pt also reports noticing a "lump" in her lateral R wrist as well.

## 2015-07-31 NOTE — ED Provider Notes (Signed)
CSN: [id number]     Arrival date & time 07/31/15  1745 History  By signing my name below, I, Michelle Pope, attest that this documentation has been prepared under the direction and in the presence of Michelle Pope, [place]. Electronically Signed: Angelene Pope, ED Scribe. 07/31/2015. 6:50 PM.    Chief Complaint  Patient presents with  . Wrist Pain  . Back Pain   The history is provided by the patient. No language interpreter was used.   HPI Comments: Michelle Pope is a 25 y.o. female who presents to the Emergency Department complaining of gradually worsening right lower back pain onset one month ago. She also c/o of right wrist pain concentrated in the radials ide of her wrist and the base of her thumb with a "lump" on the radial wrist. She states that she has tried Ibuprofen with no relief. 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 She denies any heavy lifting, falls, or trauma. She denies any numbness/tingling, bladder/bowel incontinence, or any urinary symptoms.    Past Medical History  Diagnosis Date  . Asthma    Past Surgical History  Procedure Laterality Date  . Wisdom tooth extraction     No family history on file. Social History  Substance Use Topics  . Smoking status: Current Some Day Smoker    Types: Cigarettes  . Smokeless tobacco: Never Used  . Alcohol Use: Yes     Comment: occasionally   OB History    No data available     Review of Systems  Genitourinary: Negative for dysuria and frequency.  Musculoskeletal: Positive for back pain and arthralgias (right thumb ). Negative for gait problem.  Neurological: Negative for weakness and numbness.  All other systems reviewed and are negative.     Allergies  Review of patient's allergies indicates no known allergies.  Home Medications   Prior to Admission medications   Medication Sig Start Date End Date Taking? Authorizing Provider  ibuprofen (ADVIL,MOTRIN) 200 MG tablet Take 800 mg  by mouth every 6 (six) hours as needed for mild pain or moderate pain.   Yes Historical Provider, MD   BP 149/84 mmHg  Pulse 80  Temp(Src) 97.8 F (36.6 C) (Oral)  Resp 20  SpO2 99%  LMP 07/18/2015 Physical Exam  Constitutional: She is oriented to person, place, and time. She appears well-developed and well-nourished. No distress.  HENT:  Head: Normocephalic and atraumatic.  Eyes: Conjunctivae and EOM are normal.  Neck: Neck supple. No tracheal deviation present.  Cardiovascular: Normal rate.   Pulmonary/Chest: Effort normal. No respiratory distress.  Musculoskeletal: Normal range of motion.  No spinal or paraspinal tenderness.  Right wrist and hand nontender. No snuffbox tenderness. FROM. Good cap refill. +finkelstein's test.  Neurological: She is alert and oriented to person, place, and time.  Skin: Skin is warm and dry.  Psychiatric: She has a normal mood and affect. Her behavior is normal.  Nursing note and vitals reviewed.   ED Course  Procedures (including critical care time) DIAGNOSTIC STUDIES: Oxygen Saturation is 99% on RA, normal by my interpretation.    COORDINATION OF CARE: 6:48 PM- Pt advised of plan for treatment and pt agrees. Explained that pt's symptom does not warrant an x-ray at this time. Pt will receive muscle relaxants and anti-inflammatories.    MDM   Final diagnoses:  Low back strain, initial encounter  De Quervain's tenosynovitis, right    Pt with gradual onset right lower back pain  and right wrist pain. Exam nonfocal with no focal neuro findings or tenderness. She does have positive finkelsteins and i suspect her wrist pain is 2/2 de quervain tenosynovitis. 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 Wrist splint given for wrist. Pt started on motrin and steroids with rx for same at home. Resource guide given to establish pcp. Work note given per pt request. ER return  precautions given.   I personally performed the services described in this documentation, which was scribed in my presence. The recorded information has been reviewed and is accurate.   Michelle Coria, PA-C 07/31/15 1934  Michelle Bale, MD 07/31/15 [phone]

## 2015-07-31 NOTE — Discharge Instructions (Signed)
Your back pain and wrist pain are likely due to overuse and soft tissue inflammation. You do not need x-rays at this time. I will give you prescriptions for pain medicine and steroids. We also gave you a wrist splint to help your wrist rest. Please follow up with your primary care provider within one week. If you do not have one please contact the list below. Return to the ER for new or worsening symptoms.  Take medications as prescribed. Return to the emergency room for worsening condition or new concerning symptoms. Follow up with your regular doctor. If you don't have a regular doctor use one of the numbers below to establish a primary care doctor.   Emergency Department Resource Guide 1) Find a Doctor and Pay Out of Pocket Although you won't have to find out who is covered by your insurance plan, it is a good idea to ask around and get recommendations. You will then need to call the office and see if the doctor you have chosen will accept you as a new patient and what types of options they offer for patients who are self-pay. Some doctors offer discounts or will set up payment plans for their patients who do not have insurance, but you will need to ask so you aren't surprised when you get to your appointment.  2) Contact Your Local Health Department Not all health departments have doctors that can see patients for sick visits, but many do, so it is worth a call to see if yours does. If you don't know where your local health department is, you can check in your phone book. The CDC also has a tool to help you locate your state's health department, and many state websites also have listings of all of their local health departments.  3) Find a Walk-in Clinic If your illness is not likely to be very severe or complicated, you may want to try a walk in clinic. These are popping up all over the country in pharmacies, drugstores, and shopping centers. They're usually staffed by nurse practitioners or  physician assistants that have been trained to treat common illnesses and complaints. They're usually fairly quick and inexpensive. However, if you have serious medical issues or chronic medical problems, these are probably not your best option.  No Primary Care Doctor: - Call Health Connect at  [phone] - they can help you locate a primary care doctor that  accepts your insurance, provides certain services, etc. - Physician Referral [phone]  Emergency Department Resource Guide 1) Find a Doctor and Pay Out of Pocket Although you won't have to find out who is covered by your insurance plan, it is a good idea to ask around and get recommendations. You will then need to call the office and see if the doctor you have chosen will accept you as a new patient and what types of options they offer for patients who are self-pay. Some doctors offer discounts or will set up payment plans for their patients who do not have insurance, but you will need to ask so you aren't surprised when you get to your appointment.  2) Contact Your Local Health Department Not all health departments have doctors that can see patients for sick visits, but many do, so it is worth a call to see if yours does. If you don't know where your local health department is, you can check in your phone book. The CDC also has a tool to help you locate your state's health department,  and many state websites also have listings of all of their local health departments.  3) Find a Walk-in Clinic If your illness is not likely to be very severe or complicated, you may want to try a walk in clinic. These are popping up all over the country in pharmacies, drugstores, and shopping centers. They're usually staffed by nurse practitioners or physician assistants that have been trained to treat common illnesses and complaints. They're usually fairly quick and inexpensive. However, if you have serious medical issues or chronic medical problems,  these are probably not your best option.  No Primary Care Doctor: - Call Health Connect at  [phone] - they can help you locate a primary care doctor that  accepts your insurance, provides certain services, etc. - Physician Referral Service- [phone]  Chronic Pain Problems: Organization         Address  Phone   Notes  [facility]  [phone] Patients need to be referred by their primary care doctor.   Medication Assistance: Organization         Address  Phone   Notes  [facility] Med[facility] [address] [place], [place] [address] [phone] --Must be a resident of [facility] -- Must have NO insurance coverage whatsoever (no Medicaid/ Medicare, etc.) -- The pt. MUST have a primary care doctor that directs their care regularly and follows them in the community   [facility]  [phone]   [employer]  [phone]    Agencies that provide inexpensive medical care: Organization         Address  Phone   Notes  [facility]  [phone]   [facility]    [phone]   [facility] [address] [place], [place] [address] [phone]   [facility] [address] [place] [place] [phone]   [facility]    [phone]   [facility]    [phone]   [facility]  [address], [place] Phone:  [phone], Fax:  [phone] Hours of Operation:  9 am - 6 pm, M-F.  Also accepts Medicaid/Medicare and self-pay.  [facility]  [address], [place] Phone: [phone], Fax: [phone]. Hours of Operation:  8:30 am - 5:30 pm, M-F.  Also accepts Medicaid and self-pay.  [facility] [place] [address] [place] Phone: [phone]   [facility] [address] Natasha Bence [place], [place] [phone], Ext. [phone] Mondays &  Thursdays: 7-9 AM.  First 15 patients are seen on a first come, first serve basis.    Medicaid-accepting [facility] Providers:  Organization         Address  Phone   Notes  [facility] [address], [place] [phone] Also accepts self-pay patients.  [facility] [address] Laurell Josephs [place], [place]  [phone]   [facility] [address], [place] [phone]   [facility] [address] [place] [phone]   Renaye Rakers [address], [place]   [phone] Only accepts [place] patients after they have their name applied to their card.   Self-Pay (no insurance) in [facility]:  Organization  Address  Phone   Notes  Sickle Cell Patients, [facility] [address] [place], [place] [phone]   [facility] [address] [place], [place] [phone]   [facility] [place]  [address], [place] [phone]   [facility]/Dr. Osei-Bonsu  [address] [place] or [address], [place] [phone] Phone number for both [place] and [place] locations is the same.  [facility] [address] [place] [phone]   [facility] [address], [place] or [address] [phone]   [facility] [address], [place] [phone], phone; [phone], fax Sees patients 1st and 3rd Saturday of every month.  Must not qualify for public or private insurance (i.e. Medicaid, Medicare, [place] Health Choice, Veterans' Benefits)  Household income should be no more than 200% of the poverty level The clinic cannot treat you if you are pregnant or think you are pregnant  Sexually transmitted diseases are not treated at the  clinic.    De Quervain Tenosynovitis Tendons attach muscles to bones. They also help with joint movements. When tendons become irritated or swollen, it is called tendinitis. The extensor pollicis brevis (EPB) tendon connects the EPB muscle to a bone that is near the base of the thumb. The EPB muscle helps to straighten and extend the thumb. De Quervain tenosynovitis is a condition in which the EPB tendon lining (sheath) becomes irritated, thickened, and swollen. This condition is sometimes called stenosing tenosynovitis. This condition causes pain on the thumb side of the back of the wrist. CAUSES Causes of this condition include:  Activities that repeatedly cause your thumb and wrist to extend.  A sudden increase in activity or change in activity that affects your wrist. RISK FACTORS: This condition is more likely to develop in:  Females.  People who have diabetes.  Women who have recently given birth.  People who are over 43 years of age.  People who do activities that involve repeated hand and wrist motions, such as tennis, racquetball, volleyball, gardening, and taking care of children.  People who do heavy labor.  People who have poor wrist strength and flexibility.  People who do not warm up properly before activities. SYMPTOMS Symptoms of this condition include:  Pain or tenderness over the thumb side of the back of the wrist when your thumb and wrist are not moving.  Pain that gets worse when you straighten your thumb or extend your thumb or wrist.  Pain when the injured area is touched.  Locking or catching of the thumb joint while you bend and straighten your thumb.  Decreased thumb motion due to pain.  Swelling over the affected area. DIAGNOSIS This condition is diagnosed with a medical history and physical exam. Your health care provider will ask for details about your injury and ask about your symptoms. TREATMENT Treatment may include the use of icing and  medicines to reduce pain and swelling. You may also be advised to wear a splint or brace to limit your thumb and wrist motion. In less severe cases, treatment may also include working with a physical therapist to strengthen your wrist and calm the irritation around your EPB tendon sheath. In severe cases, surgery may be needed. HOME CARE INSTRUCTIONS If You Have a Splint or Brace:  Wear it as told by your health care  provider. Remove it only as told by your health care provider.  Loosen the splint or brace if your fingers become numb and tingle, or if they turn cold and blue.  Keep the splint or brace clean and dry. Managing Pain, Stiffness, and Swelling   If directed, apply ice to the injured area.  Put ice in a plastic bag.  Place a towel between your skin and the bag.  Leave the ice on for 20 minutes, 2-3 times per day.  Move your fingers often to avoid stiffness and to lessen swelling.  Raise (elevate) the injured area above the level of your heart while you are sitting or lying down. General Instructions  Return to your normal activities as told by your health care provider. Ask your health care provider what activities are safe for you.  Take over-the-counter and prescription medicines only as told by your health care provider.  Keep all follow-up visits as told by your health care provider. This is important.  Do not drive or operate heavy machinery while taking prescription pain medicine. SEEK MEDICAL CARE IF:  Your pain, tenderness, or swelling gets worse, even if you have had treatment.  You have numbness or tingling in your wrist, hand, or fingers on the injured side.   This information is not intended to replace advice given to you by your health care provider. Make sure you discuss any questions you have with your health care provider.   Document Released: 06/27/2005 Document Revised: 03/18/2015 Document Reviewed: 09/02/2014 Elsevier Interactive Patient Education  [employer]

## 2016-03-13 ENCOUNTER — Encounter (HOSPITAL_COMMUNITY): Payer: Self-pay | Admitting: Emergency Medicine

## 2016-03-13 ENCOUNTER — Emergency Department (HOSPITAL_COMMUNITY)
Admission: EM | Admit: 2016-03-13 | Discharge: 2016-03-13 | Disposition: A | Discharge status: Home / Self Care | Payer: BLUE CROSS/BLUE SHIELD | Attending: Emergency Medicine | Admitting: Emergency Medicine

## 2016-03-13 DIAGNOSIS — J069 Acute upper respiratory infection, unspecified: Secondary | ICD-10-CM | POA: Insufficient documentation

## 2016-03-13 DIAGNOSIS — F17210 Nicotine dependence, cigarettes, uncomplicated: Secondary | ICD-10-CM | POA: Insufficient documentation

## 2016-03-13 DIAGNOSIS — J45909 Unspecified asthma, uncomplicated: Secondary | ICD-10-CM | POA: Diagnosis not present

## 2016-03-13 DIAGNOSIS — B9789 Other viral agents as the cause of diseases classified elsewhere: Secondary | ICD-10-CM

## 2016-03-13 DIAGNOSIS — Z79899 Other long term (current) drug therapy: Secondary | ICD-10-CM | POA: Insufficient documentation

## 2016-03-13 DIAGNOSIS — R05 Cough: Secondary | ICD-10-CM | POA: Diagnosis present

## 2016-03-13 DIAGNOSIS — J988 Other specified respiratory disorders: Secondary | ICD-10-CM

## 2016-03-13 LAB — RAPID STREP SCREEN (MED CTR MEBANE ONLY): STREPTOCOCCUS, GROUP A SCREEN (DIRECT): NEGATIVE

## 2016-03-13 MED ORDER — ALBUTEROL SULFATE HFA 108 (90 BASE) MCG/ACT IN AERS: 1.0000 | INHALATION_SPRAY | Freq: Four times a day (QID) | RESPIRATORY_TRACT | 0 refills | Status: AC | PRN

## 2016-03-13 MED ORDER — HYDROCODONE-HOMATROPINE 5-1.5 MG/5ML PO SYRP: 5.0000 mL | ORAL_SOLUTION | Freq: Four times a day (QID) | ORAL | 0 refills | Status: DC | PRN

## 2016-03-13 MED ORDER — IBUPROFEN 800 MG PO TABS: 800.0000 mg | ORAL_TABLET | Freq: Three times a day (TID) | ORAL | 0 refills | Status: DC | PRN

## 2016-03-13 MED ORDER — DM-GUAIFENESIN ER 30-600 MG PO TB12: 1.0000 | ORAL_TABLET | Freq: Two times a day (BID) | ORAL | 0 refills | Status: DC | PRN

## 2016-03-13 NOTE — ED Triage Notes (Signed)
Pt reports cough, headache, facial pressure, sore throat x 6 days. Tx with OTC meds

## 2016-03-13 NOTE — ED Notes (Signed)
Pt stated "I've been sick since Tuesday with the sore throat.  The cough started Wednesday.  I've tried Delsym and now taking Robitussin."

## 2016-03-13 NOTE — Discharge Instructions (Signed)
Read the information below.  Use the prescribed medication as directed.  Please discuss all new medications with your pharmacist.  You may return to the Emergency Department at any time for worsening condition or any new symptoms that concern you.  If you develop high fevers that do not resolve with tylenol or ibuprofen, you have difficulty swallowing or breathing, or you are unable to tolerate fluids by mouth, return to the ER for a recheck.    °

## 2016-03-13 NOTE — ED Provider Notes (Signed)
[facility]-EMERGENCY DEPT Provider Note   CSN: [id number] Arrival date & time: 03/13/16  1756   By signing my name below, I, Clovis Pu, attest that this documentation has been prepared under the direction and in the presence of  [facility], PA-C. Electronically Signed: Clovis Pu, ED Scribe. 03/13/16. 9:02 PM.   History   Chief Complaint Chief Complaint  Patient presents with  . Sore Throat  . Cough    The history is provided by the patient. No language interpreter was used.     Michelle Pope is a 25 y.o. female who presents to the Emergency Department complaining of moderate, gradually worsening sore throat onset 5 days ago with an associated dry cough.  She also notes chest discomfort only with coughing. Pt states she had a fever of 101 degrees 5 days ago but has had none since. She notes she has taken ibuprofen with mild relief. Pt states she took Robitussin yesterday with no relief. 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 She also notes she traveled to [place] before the onset of her symptoms and states the weather change may have affected her symptoms. Pt denies SOB.     Past Medical History:  Diagnosis Date  . Asthma     There are no active problems to display for this patient.   Past Surgical History:  Procedure Laterality Date  . WISDOM TOOTH EXTRACTION      OB History    No data available       Home Medications    Prior to Admission medications   Medication Sig Start Date End Date Taking? Authorizing Provider  albuterol (PROVENTIL HFA;VENTOLIN HFA) 108 (90 Base) MCG/ACT inhaler Inhale 1-2 puffs into the lungs every 6 (six) hours as needed for wheezing or shortness of breath. 03/13/16   Trixie Dredge, PA-C  dextromethorphan-guaiFENesin (MUCINEX DM) 30-600 MG 12hr tablet Take 1 tablet by mouth 2 (two) times daily as needed for cough. 03/13/16   Trixie Dredge, PA-C  HYDROcodone-homatropine  [facility]) 5-1.5 MG/5ML syrup Take 5 mLs by mouth every 6 (six) hours as needed for cough. 03/13/16   Trixie Dredge, PA-C  ibuprofen (ADVIL,MOTRIN) 800 MG tablet Take 1 tablet (800 mg total) by mouth every 8 (eight) hours as needed for mild pain or moderate pain. 03/13/16   Trixie Dredge, PA-C  naproxen (NAPROSYN) 500 MG tablet Take 1 tablet (500 mg total) by mouth 2 (two) times daily. 07/31/15   Ace Gins Sam, PA-C  predniSONE (DELTASONE) 20 MG tablet Take 2 tablets (40 mg total) by mouth daily. 07/31/15   Carlene Coria, PA-C    Family History Family History  Problem Relation Age of Onset  . Hypertension Mother   . Diabetes Mother   . Hyperlipidemia Mother     Social History Social History  Substance Use Topics  . Smoking status: Current Some Day Smoker    Types: Cigarettes  . Smokeless tobacco: Never Used  . Alcohol use Yes     Comment: occasionally     Allergies   Review of patient's allergies indicates no known allergies.   Review of Systems Review of Systems  Constitutional: Negative for chills and fever.  HENT: Positive for congestion, rhinorrhea and sore throat. Negative for facial swelling and trouble swallowing.   Respiratory: Positive for cough. Negative for shortness of breath.   Cardiovascular: Positive for chest pain (only with coughing).  Skin: Negative  for rash.  Allergic/Immunologic: Negative for immunocompromised state.     Physical Exam Updated Vital Signs BP 126/59 (BP Location: Left Arm)   Pulse 79   Temp 97.4 F (36.3 C) (Oral)   Resp 18   LMP 02/04/2016 (Exact Date)   SpO2 100%   Physical Exam  Constitutional: She appears well-developed and well-nourished. No distress.  HENT:  Head: Normocephalic and atraumatic.  Nose: Mucosal edema present.  Mouth/Throat: Uvula is midline. Mucous membranes are not dry. No uvula swelling. Posterior oropharyngeal edema and posterior oropharyngeal erythema present. No oropharyngeal exudate or tonsillar abscesses.  Eyes:  Conjunctivae are normal.  Neck: Trachea normal, normal range of motion and phonation normal. Neck supple. No tracheal tenderness present. No neck rigidity. No tracheal deviation, no edema, no erythema and normal range of motion present.  Cardiovascular: Normal rate and regular rhythm.   Pulmonary/Chest: Effort normal and breath sounds normal. No stridor. No respiratory distress. She has no wheezes. She has no rales.  Lymphadenopathy:    She has no cervical adenopathy.  Neurological: She is alert.  Skin: She is not diaphoretic.  Nursing note and vitals reviewed.    ED Treatments / Results  DIAGNOSTIC STUDIES:  Oxygen Saturation is 100% on room air, normal by my interpretation.    COORDINATION OF CARE:  7:47 PM Symptomatic therapy.Discussed treatment plan with pt at bedside and pt agreed to plan.  Labs (all labs ordered are listed, but only abnormal results are displayed) Labs Reviewed  RAPID STREP SCREEN (NOT AT [facility])  CULTURE, GROUP A STREP [facility])    EKG  EKG Interpretation None       Radiology No results found.  Procedures Procedures (including critical care time)  Medications Ordered in ED Medications - No data to display   Initial Impression / Assessment and Plan / ED Course  I have reviewed the triage vital signs and the nursing notes.  Pertinent labs & imaging results that were available during my care of the patient were reviewed by me and considered in my medical decision making (see chart for details).  Clinical Course    Afebrile, nontoxic patient with constellation of symptoms suggestive of viral syndrome.  No concerning findings on exam.  Discharged home with supportive care, PCP follow up. Discussed result, findings, treatment, and follow up  with patient.  Pt given return precautions.  Pt verbalizes understanding and agrees with plan.      Final Clinical Impressions(s) / ED Diagnoses   Final diagnoses:  Viral respiratory infection    New  Prescriptions Discharge Medication List as of 03/13/2016  7:54 PM    START taking these medications   Details  albuterol (PROVENTIL HFA;VENTOLIN HFA) 108 (90 Base) MCG/ACT inhaler Inhale 1-2 puffs into the lungs every 6 (six) hours as needed for wheezing or shortness of breath., Starting Sun 03/13/2016, Print    dextromethorphan-guaiFENesin (MUCINEX DM) 30-600 MG 12hr tablet Take 1 tablet by mouth 2 (two) times daily as needed for cough., Starting Sun 03/13/2016, Print    HYDROcodone-homatropine (HYCODAN) 5-1.5 MG/5ML syrup Take 5 mLs by mouth every 6 (six) hours as needed for cough., Starting Sun 03/13/2016, Print       I personally performed the services described in this documentation, which was scribed in my presence. The recorded information has been reviewed and is accurate.     Trixie Dredgemily Cailynn Bodnar, PA-C 03/13/16 2157    Loren Raceravid Yelverton, MD 03/13/16 [phone]

## 2016-03-16 LAB — CULTURE, GROUP A STREP (THRC)

## 2016-06-20 ENCOUNTER — Emergency Department (HOSPITAL_COMMUNITY)
Admission: EM | Admit: 2016-06-20 | Discharge: 2016-06-20 | Disposition: A | Discharge status: Home / Self Care | Payer: BLUE CROSS/BLUE SHIELD | Attending: Emergency Medicine | Admitting: Emergency Medicine

## 2016-06-20 ENCOUNTER — Encounter (HOSPITAL_COMMUNITY): Payer: Self-pay | Admitting: Emergency Medicine

## 2016-06-20 ENCOUNTER — Emergency Department (HOSPITAL_COMMUNITY): Payer: BLUE CROSS/BLUE SHIELD

## 2016-06-20 ENCOUNTER — Emergency Department (HOSPITAL_COMMUNITY): Payer: PRIVATE HEALTH INSURANCE

## 2016-06-20 DIAGNOSIS — X58XXXA Exposure to other specified factors, initial encounter: Secondary | ICD-10-CM

## 2016-06-20 DIAGNOSIS — R109 Unspecified abdominal pain: Secondary | ICD-10-CM | POA: Diagnosis not present

## 2016-06-20 DIAGNOSIS — F17210 Nicotine dependence, cigarettes, uncomplicated: Secondary | ICD-10-CM | POA: Diagnosis not present

## 2016-06-20 DIAGNOSIS — J45909 Unspecified asthma, uncomplicated: Secondary | ICD-10-CM | POA: Diagnosis not present

## 2016-06-20 DIAGNOSIS — R1031 Right lower quadrant pain: Secondary | ICD-10-CM | POA: Diagnosis present

## 2016-06-20 LAB — I-STAT BETA HCG BLOOD, ED (MC, WL, AP ONLY)

## 2016-06-20 LAB — URINALYSIS, ROUTINE W REFLEX MICROSCOPIC
Bilirubin Urine: NEGATIVE
GLUCOSE, UA: NEGATIVE mg/dL
Hgb urine dipstick: NEGATIVE
KETONES UR: NEGATIVE mg/dL
LEUKOCYTES UA: NEGATIVE
Nitrite: NEGATIVE
PROTEIN: NEGATIVE mg/dL
Specific Gravity, Urine: 1.023 (ref 1.005–1.030)
pH: 7 (ref 5.0–8.0)

## 2016-06-20 LAB — WET PREP, GENITAL
Clue Cells Wet Prep HPF POC: NONE SEEN
SPERM: NONE SEEN
TRICH WET PREP: NONE SEEN
WBC, Wet Prep HPF POC: NONE SEEN
YEAST WET PREP: NONE SEEN

## 2016-06-20 LAB — COMPREHENSIVE METABOLIC PANEL
ALK PHOS: 40 U/L (ref 38–126)
ALT: 12 U/L — ABNORMAL LOW (ref 14–54)
ANION GAP: 5 (ref 5–15)
AST: 17 U/L (ref 15–41)
Albumin: 3.8 g/dL (ref 3.5–5.0)
BUN: 10 mg/dL (ref 6–20)
CALCIUM: 8.5 mg/dL — AB (ref 8.9–10.3)
CO2: 26 mmol/L (ref 22–32)
Chloride: 106 mmol/L (ref 101–111)
Creatinine, Ser: 0.67 mg/dL (ref 0.44–1.00)
GFR calc non Af Amer: >60 mL/min (ref >60)
Glucose, Bld: 101 mg/dL — ABNORMAL HIGH (ref 65–99)
POTASSIUM: 4.1 mmol/L (ref 3.5–5.1)
SODIUM: 137 mmol/L (ref 135–145)
TOTAL PROTEIN: 7.4 g/dL (ref 6.5–8.1)
Total Bilirubin: 0.5 mg/dL (ref 0.3–1.2)

## 2016-06-20 LAB — CBC
HCT: 39 % (ref 36.0–46.0)
HEMOGLOBIN: 12.5 g/dL (ref 12.0–15.0)
MCH: 27.5 pg (ref 26.0–34.0)
MCHC: 32.1 g/dL (ref 30.0–36.0)
MCV: 85.7 fL (ref 78.0–100.0)
Platelets: 231 10*3/uL (ref 150–400)
RBC: 4.55 MIL/uL (ref 3.87–5.11)
RDW: 13.9 % (ref 11.5–15.5)
WBC: 7.3 10*3/uL (ref 4.0–10.5)

## 2016-06-20 LAB — LIPASE, BLOOD: Lipase: 21 U/L (ref 11–51)

## 2016-06-20 MED ORDER — HYDROCODONE-ACETAMINOPHEN 5-325 MG PO TABS: 1.0000 | ORAL_TABLET | ORAL | 0 refills | Status: DC | PRN

## 2016-06-20 MED ORDER — ONDANSETRON HCL 4 MG/2ML IJ SOLN
4.0000 mg | Freq: Once | INTRAMUSCULAR | Status: AC
  Administered 2016-06-20: 4 mg via INTRAVENOUS
  Filled 2016-06-20: qty 2

## 2016-06-20 MED ORDER — SODIUM CHLORIDE 0.9 % IV SOLN
INTRAVENOUS | Status: DC
  Administered 2016-06-20: 10:00:00 via INTRAVENOUS

## 2016-06-20 MED ORDER — HYDROMORPHONE HCL 1 MG/ML IJ SOLN
1.0000 mg | INTRAMUSCULAR | Status: DC | PRN
  Administered 2016-06-20 (×2): 1 mg via INTRAVENOUS
  Filled 2016-06-20 (×2): qty 1

## 2016-06-20 NOTE — ED Triage Notes (Signed)
Pt reports sharp RLQ pain, denies urinary symptoms. denies nausea, no emesis, no diarrhea. sts irregular menstrual cycle. sts possible pregnancy.

## 2016-06-20 NOTE — ED Notes (Signed)
Patient transported to Ultrasound 

## 2016-06-20 NOTE — ED Provider Notes (Signed)
[facility]-EMER[facility]Y DEPT Provider Note   CSN: [id number] Arrival date & time: 06/20/16  0803     History   Chief Complaint Chief Complaint  Patient presents with  . Abdominal Pain    right lower     HPI Michelle Pope is a 25 y.o. female.  She presents for evaluation of lower abdominal pain, right greater than left, present for 1 week, intermittently. The pain is aggravated with both bowel movements and urination. She has intermittent diarrhea. She has chronic menstrual irregularity with last menstrual period 05/13/2016. She denies vaginal discharge, or bleeding. She denies fever, chills, nausea, vomiting, anorexia, weakness or dizziness. There are no other known modifying factors.  HPI  Past Medical History:  Diagnosis Date  . Asthma     There are no active problems to display for this patient.   Past Surgical History:  Procedure Laterality Date  . WISDOM TOOTH EXTRACTION      OB History    No data available       Home Medications    Prior to Admission medications   Medication Sig Start Date End Date Taking? Authorizing Provider  albuterol (PROVENTIL HFA;VENTOLIN HFA) 108 (90 Base) MCG/ACT inhaler Inhale 1-2 puffs into the lungs every 6 (six) hours as needed for wheezing or shortness of breath. Patient not taking: Reported on 06/20/2016 03/13/16   Trixie Dredge, PA-C  dextromethorphan-guaiFENesin [facility] DM) 30-600 MG 12hr tablet Take 1 tablet by mouth 2 (two) times daily as needed for cough. Patient not taking: Reported on 06/20/2016 03/13/16   Trixie Dredge, PA-C  HYDROcodone-acetaminophen [facility]) 5-325 MG tablet Take 1 tablet by mouth every 4 (four) hours as needed. 06/20/16   Mancel Bale, MD  HYDROcodone-homatropine [facility]) 5-1.5 MG/5ML syrup Take 5 mLs by mouth every 6 (six) hours as needed for cough. Patient not taking: Reported on 06/20/2016 03/13/16   Trixie Dredge, PA-C  ibuprofen (ADVIL,MOTRIN) 800 MG tablet Take 1 tablet (800 mg total) by mouth every 8 (eight)  hours as needed for mild pain or moderate pain. Patient not taking: Reported on 06/20/2016 03/13/16   Trixie Dredge, PA-C  naproxen (NAPROSYN) 500 MG tablet Take 1 tablet (500 mg total) by mouth 2 (two) times daily. Patient not taking: Reported on 06/20/2016 07/31/15   Ace Gins Sam, PA-C  predniSONE (DELTASONE) 20 MG tablet Take 2 tablets (40 mg total) by mouth daily. Patient not taking: Reported on 06/20/2016 07/31/15   Carlene Coria, PA-C    Family History Family History  Problem Relation Age of Onset  . Hypertension Mother   . Diabetes Mother   . Hyperlipidemia Mother     Social History Social History  Substance Use Topics  . Smoking status: Current Some Day Smoker    Types: Cigarettes  . Smokeless tobacco: Never Used  . Alcohol use Yes     Comment: occasionally     Allergies   Patient has no known allergies.   Review of Systems Review of Systems  All other systems reviewed and are negative.    Physical Exam Updated Vital Signs BP 127/55 (BP Location: Left Arm)   Pulse 75   Temp 98.2 F (36.8 C) (Oral)   Resp 16   LMP 05/18/2016   SpO2 100%   Physical Exam  Constitutional: She is oriented to person, place, and time. She appears well-developed. No distress.  Obese  HENT:  Head: Normocephalic and atraumatic.  Eyes: Conjunctivae and EOM are normal. Pupils are equal, round, and reactive to light.  Neck: Normal range of motion and phonation normal. Neck supple.  Cardiovascular: Normal rate and regular rhythm.   Pulmonary/Chest: Effort normal and breath sounds normal. She exhibits no tenderness.  Abdominal: Soft. She exhibits no distension. There is no tenderness. There is no guarding.  No abdominal tenderness  Musculoskeletal: Normal range of motion.  Neurological: She is alert and oriented to person, place, and time. She exhibits normal muscle tone.  Skin: Skin is warm and dry.  Psychiatric: She has a normal mood and affect. Her behavior is normal. Judgment and  thought content normal.  Nursing note and vitals reviewed.    ED Treatments / Results  Labs (all labs ordered are listed, but only abnormal results are displayed) Labs Reviewed  COMPREHENSIVE METABOLIC PANEL - Abnormal; Notable for the following:       Result Value   Glucose, Bld 101 (*)    Calcium 8.5 (*)    ALT 12 (*)    All other components within normal limits  URINALYSIS, ROUTINE W REFLEX MICROSCOPIC - Abnormal; Notable for the following:    APPearance HAZY (*)    All other components within normal limits  WET PREP, GENITAL  LIPASE, BLOOD  CBC  RPR  HIV ANTIBODY (ROUTINE TESTING)  I-STAT BETA HCG BLOOD, ED ([facility], [facility], [facility] ONLY)  GC/CHLAMYDIA PROBE AMP ([facility]) NOT AT [facility]    EKG  EKG Interpretation None       Radiology [place] Transvaginal Non-ob  Result Date: 06/20/2016 CLINICAL DATA:  Right lower quadrant pain.  Irregular cycles. EXAM: TRANSABDOMINAL AND TRANSVAGINAL ULTRASOUND OF PELVIS TECHNIQUE: Both transabdominal and transvaginal ultrasound examinations of the pelvis were performed. Transabdominal technique was performed for global imaging of the pelvis including uterus, ovaries, adnexal regions, and pelvic cul-de-sac. It was necessary to proceed with endovaginal exam following the transabdominal exam to visualize the uterus, ovaries, and adnexa. COMPARISON:  01/03/2011 FINDINGS: Uterus Measurements: 11.0 x 3.8 x 5.0 cm. No fibroids or other mass visualized. Endometrium Thickness: Normal, 3 mm. Right ovary Measurements: 4.5 x 2.4 x 3.5 cm. Suspect complex follicles within, including at 1.9 cm on image 20. Left ovary Measurements: 3.5 x 3.1 x 2.9 cm. Normal appearance/no adnexal mass. Other findings No abnormal free fluid. Exam limited by patient body habitus. IMPRESSION: 1. Suspect at least 1 complex follicle in the right ovary. 2. No other explanation for patient's symptoms. 3. Decreased sensitivity and specificity exam due to technique related factors, as described  above. Electronically Signed   By: Jeronimo GreavesKyle  Talbot M.D.   On: 06/20/2016 15:14   [place] Pelvis Complete  Result Date: 06/20/2016 CLINICAL DATA:  Right lower quadrant pain.  Irregular cycles. EXAM: TRANSABDOMINAL AND TRANSVAGINAL ULTRASOUND OF PELVIS TECHNIQUE: Both transabdominal and transvaginal ultrasound examinations of the pelvis were performed. Transabdominal technique was performed for global imaging of the pelvis including uterus, ovaries, adnexal regions, and pelvic cul-de-sac. It was necessary to proceed with endovaginal exam following the transabdominal exam to visualize the uterus, ovaries, and adnexa. COMPARISON:  01/03/2011 FINDINGS: Uterus Measurements: 11.0 x 3.8 x 5.0 cm. No fibroids or other mass visualized. Endometrium Thickness: Normal, 3 mm. Right ovary Measurements: 4.5 x 2.4 x 3.5 cm. Suspect complex follicles within, including at 1.9 cm on image 20. Left ovary Measurements: 3.5 x 3.1 x 2.9 cm. Normal appearance/no adnexal mass. Other findings No abnormal free fluid. Exam limited by patient body habitus. IMPRESSION: 1. Suspect at least 1 complex follicle in the right ovary. 2. No other explanation for patient's symptoms. 3.  Decreased sensitivity and specificity exam due to technique related factors, as described above. Electronically Signed   By: Jeronimo GreavesKyle  Talbot M.D.   On: 06/20/2016 15:14    Procedures Procedures (including critical care time)  Medications Ordered in ED Medications  0.9 %  sodium chloride infusion ( Intravenous New Bag/Given 06/20/16 0950)  HYDROmorphone (DILAUDID) injection 1 mg (1 mg Intravenous Given 06/20/16 0952)  ondansetron (ZOFRAN) injection 4 mg (4 mg Intravenous Given 06/20/16 0952)     Initial Impression / Assessment and Plan / ED Course  I have reviewed the triage vital signs and the nursing notes.  Pertinent labs & imaging results that were available during my care of the patient were reviewed by me and considered in my medical decision making (see  chart for details).  Clinical Course     Medications  0.9 %  sodium chloride infusion ( Intravenous New Bag/Given 06/20/16 0950)  HYDROmorphone (DILAUDID) injection 1 mg (1 mg Intravenous Given 06/20/16 0952)  ondansetron (ZOFRAN) injection 4 mg (4 mg Intravenous Given 06/20/16 0952)    Patient Vitals for the past 24 hrs:  BP Temp Temp src Pulse Resp SpO2  06/20/16 1527 127/55 - - 75 16 100 %  06/20/16 1300 127/66 - - 62 16 99 %  06/20/16 1230 126/67 - - - - -  06/20/16 1130 133/71 - - 69 16 97 %  06/20/16 1030 121/78 - - 71 16 98 %  06/20/16 0947 141/70 - - 60 16 100 %  06/20/16 0814 121/64 - - - - -  06/20/16 0811 - 98.2 F (36.8 C) Oral 80 16 99 %    3:29 PM Reevaluation with update and discussion. After initial assessment and treatment, an updated evaluation reveals She is fairly comfortable at this time. Findings discussed with patient, all questions were answered. Nazareth Kirk L    Final Clinical Impressions(s) / ED Diagnoses   Final diagnoses:  Unknown cause of injury  Abdominal pain, unspecified abdominal location    Nonspecific lower abdominal pain, with reassuring examination, laboratory testing, and imaging. Doubt colitis, obstruction, urinary tract infection, STD, or ovarian cyst.  Nursing Notes Reviewed/ Care Coordinated Applicable Imaging Reviewed Interpretation of Laboratory Data incorporated into ED treatment  The patient appears reasonably screened and/or stabilized for discharge and I doubt any other medical condition or other [facility] requiring further screening, evaluation, or treatment in the ED at this time prior to discharge.  Plan: Home Medications- continue; Home Treatments- rest; return here if the recommended treatment, does not improve the symptoms; Recommended follow up- PCP prn     New Prescriptions New Prescriptions   HYDROCODONE-ACETAMINOPHEN (NORCO) 5-325 MG TABLET    Take 1 tablet by mouth every 4 (four) hours as needed.     Mancel BaleElliott  Sharniece Gibbon, MD 06/20/16 [phone]

## 2016-06-20 NOTE — ED Notes (Signed)
Patient discharged by Alvino ChapelEllen, RN

## 2016-06-20 NOTE — Discharge Instructions (Signed)
Start with clear liquid diet, then gradually advance to regular over 2 or 3 days time.  Watch for worsening symptoms including fever, chills, vomiting, increasing pain, or dizziness.  Follow-up with your doctor, or return here as needed for problems.

## 2016-06-21 LAB — HIV ANTIBODY (ROUTINE TESTING W REFLEX): HIV SCREEN 4TH GENERATION: NONREACTIVE

## 2016-06-21 LAB — RPR: RPR: NONREACTIVE

## 2016-06-21 LAB — GC/CHLAMYDIA PROBE AMP (~~LOC~~) NOT AT ARMC
Chlamydia: NEGATIVE
Neisseria Gonorrhea: NEGATIVE

## 2016-07-05 DIAGNOSIS — J45909 Unspecified asthma, uncomplicated: Secondary | ICD-10-CM | POA: Insufficient documentation

## 2016-07-05 DIAGNOSIS — B349 Viral infection, unspecified: Secondary | ICD-10-CM | POA: Diagnosis not present

## 2016-07-05 DIAGNOSIS — J029 Acute pharyngitis, unspecified: Secondary | ICD-10-CM | POA: Diagnosis present

## 2016-07-05 DIAGNOSIS — F17210 Nicotine dependence, cigarettes, uncomplicated: Secondary | ICD-10-CM | POA: Insufficient documentation

## 2016-07-06 ENCOUNTER — Emergency Department (HOSPITAL_COMMUNITY)
Admission: EM | Admit: 2016-07-06 | Discharge: 2016-07-06 | Disposition: A | Discharge status: Home / Self Care | Payer: BLUE CROSS/BLUE SHIELD | Attending: Emergency Medicine | Admitting: Emergency Medicine

## 2016-07-06 ENCOUNTER — Encounter (HOSPITAL_COMMUNITY): Payer: Self-pay | Admitting: Family Medicine

## 2016-07-06 DIAGNOSIS — B349 Viral infection, unspecified: Secondary | ICD-10-CM

## 2016-07-06 LAB — BASIC METABOLIC PANEL
ANION GAP: 10 (ref 5–15)
BUN: 7 mg/dL (ref 6–20)
CHLORIDE: 103 mmol/L (ref 101–111)
CO2: 23 mmol/L (ref 22–32)
Calcium: 8.4 mg/dL — ABNORMAL LOW (ref 8.9–10.3)
Creatinine, Ser: 0.71 mg/dL (ref 0.44–1.00)
GFR calc Af Amer: >60 mL/min (ref >60)
GFR calc non Af Amer: >60 mL/min (ref >60)
GLUCOSE: 131 mg/dL — AB (ref 65–99)
POTASSIUM: 3.3 mmol/L — AB (ref 3.5–5.1)
Sodium: 136 mmol/L (ref 135–145)

## 2016-07-06 LAB — CBC WITH DIFFERENTIAL/PLATELET
BASOS ABS: 0 10*3/uL (ref 0.0–0.1)
Basophils Relative: 0 %
Eosinophils Absolute: 0 10*3/uL (ref 0.0–0.7)
Eosinophils Relative: 0 %
HEMATOCRIT: 36.3 % (ref 36.0–46.0)
Hemoglobin: 12 g/dL (ref 12.0–15.0)
LYMPHS PCT: 13 %
Lymphs Abs: 1.9 10*3/uL (ref 0.7–4.0)
MCH: 27.5 pg (ref 26.0–34.0)
MCHC: 33.1 g/dL (ref 30.0–36.0)
MCV: 83.3 fL (ref 78.0–100.0)
MONO ABS: 1.1 10*3/uL — AB (ref 0.1–1.0)
MONOS PCT: 7 %
NEUTROS ABS: 11.4 10*3/uL — AB (ref 1.7–7.7)
Neutrophils Relative %: 80 %
Platelets: 223 10*3/uL (ref 150–400)
RBC: 4.36 MIL/uL (ref 3.87–5.11)
RDW: 13.9 % (ref 11.5–15.5)
WBC: 14.5 10*3/uL — ABNORMAL HIGH (ref 4.0–10.5)

## 2016-07-06 LAB — URINALYSIS, ROUTINE W REFLEX MICROSCOPIC
Bilirubin Urine: NEGATIVE
GLUCOSE, UA: NEGATIVE mg/dL
HGB URINE DIPSTICK: NEGATIVE
Ketones, ur: NEGATIVE mg/dL
Leukocytes, UA: NEGATIVE
Nitrite: NEGATIVE
Protein, ur: NEGATIVE mg/dL
SPECIFIC GRAVITY, URINE: 1.004 — AB (ref 1.005–1.030)
pH: 6 (ref 5.0–8.0)

## 2016-07-06 LAB — PREGNANCY, URINE: Preg Test, Ur: NEGATIVE

## 2016-07-06 LAB — RAPID STREP SCREEN (MED CTR MEBANE ONLY): STREPTOCOCCUS, GROUP A SCREEN (DIRECT): NEGATIVE

## 2016-07-06 LAB — MONONUCLEOSIS SCREEN: Mono Screen: NEGATIVE

## 2016-07-06 MED ORDER — OSELTAMIVIR PHOSPHATE 75 MG PO CAPS: 75.0000 mg | ORAL_CAPSULE | Freq: Two times a day (BID) | ORAL | 0 refills | Status: DC

## 2016-07-06 MED ORDER — ACETAMINOPHEN 325 MG PO TABS
650.0000 mg | ORAL_TABLET | Freq: Once | ORAL | Status: AC | PRN
  Administered 2016-07-06: 650 mg via ORAL
  Filled 2016-07-06: qty 2

## 2016-07-06 NOTE — Discharge Instructions (Signed)
Tamiflu as prescribed.  Tylenol 1000 mg rotated with Motrin 600 mg every 3 hours as needed for pain or fever.  Return to the emergency department if you develop severe chest pain, difficulty breathing, bloody stools, or other new and concerning symptoms.

## 2016-07-06 NOTE — ED Triage Notes (Signed)
Patient is complaining of fever, headache, lower back pain, and sore throat. Symptoms started yesterday. Took TYLENOL 1300mg  at 10:00am.

## 2016-07-06 NOTE — ED Notes (Signed)
Pt made aware of need for urine specimen 

## 2016-07-06 NOTE — ED Notes (Signed)
Patient is alert and oriented x3.  She was given DC instructions and follow up visit instructions.  Patient gave verbal understanding. She was DC ambulatory under her own power to home.  V/S stable.  He was not showing any signs of distress on DC 

## 2016-07-06 NOTE — ED Provider Notes (Signed)
[facility]-EMERGENCY DEPT Provider Note   CSN: [id number] Arrival date & time: 07/05/16  2334 By signing my name below, I, Michelle HedgerElizabeth Pope, attest that this documentation has been prepared under the direction and in the presence of Geoffery Lyonsouglas Debanhi Blaker, MD . Electronically Signed: Levon HedgerElizabeth Pope, Scribe. 07/06/2016. 3:16 AM.   History   Chief Complaint Chief Complaint  Patient presents with  . Fever  . Headache  . Sore Throat   HPI Michelle Pope is a 25 y.o. female with a hx of asthma who presents to the Emergency Department complaining of progressively worsening sore throat onset one to two days ago. She also complains of associated headache, generalized body aches, sharp lower back pain, neck pain, and fever (TMAX 103). No alleviating or modifying factors noted.  She has taken tylenol with no relief. Pt has no known sick contact, but she works with the public as a Designer, industrial/productCAT driver. She did not receive a flu shot this year.She denies diarrhea, abdominal pain, dysuria, or any other associated symptoms.   The history is provided by the patient. No language interpreter was used.   Past Medical History:  Diagnosis Date  . Asthma     There are no active problems to display for this patient.   Past Surgical History:  Procedure Laterality Date  . WISDOM TOOTH EXTRACTION      OB History    No data available       Home Medications    Prior to Admission medications   Medication Sig Start Date End Date Taking? Authorizing Provider  albuterol (PROVENTIL HFA;VENTOLIN HFA) 108 (90 Base) MCG/ACT inhaler Inhale 1-2 puffs into the lungs every 6 (six) hours as needed for wheezing or shortness of breath. Patient not taking: Reported on 06/20/2016 03/13/16   Trixie DredgeEmily West, PA-C  dextromethorphan-guaiFENesin [facility](MUCINEX DM) 30-600 MG 12hr tablet Take 1 tablet by mouth 2 (two) times daily as needed for cough. Patient not taking: Reported on 06/20/2016 03/13/16   Trixie DredgeEmily West, PA-C  HYDROcodone-acetaminophen [facility](NORCO)  5-325 MG tablet Take 1 tablet by mouth every 4 (four) hours as needed. 06/20/16   Mancel BaleElliott Wentz, MD  HYDROcodone-homatropine [facility](HYCODAN) 5-1.5 MG/5ML syrup Take 5 mLs by mouth every 6 (six) hours as needed for cough. Patient not taking: Reported on 06/20/2016 03/13/16   Trixie DredgeEmily West, PA-C  ibuprofen (ADVIL,MOTRIN) 800 MG tablet Take 1 tablet (800 mg total) by mouth every 8 (eight) hours as needed for mild pain or moderate pain. Patient not taking: Reported on 06/20/2016 03/13/16   Trixie DredgeEmily West, PA-C  naproxen (NAPROSYN) 500 MG tablet Take 1 tablet (500 mg total) by mouth 2 (two) times daily. Patient not taking: Reported on 06/20/2016 07/31/15   Ace GinsSerena Y Sam, PA-C  predniSONE (DELTASONE) 20 MG tablet Take 2 tablets (40 mg total) by mouth daily. Patient not taking: Reported on 06/20/2016 07/31/15   Carlene CoriaSerena Y Sam, PA-C    Family History Family History  Problem Relation Age of Onset  . Hypertension Mother   . Diabetes Mother   . Hyperlipidemia Mother     Social History Social History  Substance Use Topics  . Smoking status: Current Some Day Smoker    Types: Cigarettes  . Smokeless tobacco: Never Used  . Alcohol use Yes     Comment: occasionally     Allergies   Patient has no known allergies.   Review of Systems Review of Systems 10 systems reviewed and all are negative for acute change except as noted in the HPI.   Physical  Exam Updated Vital Signs BP 153/96 (BP Location: Right Arm)   Pulse 100   Temp 99.2 F (37.3 C) (Oral)   Resp 18   Ht 5\' 2"  (1.575 m)   Wt (!) 330 lb (149.7 kg)   LMP 06/24/2016   SpO2 98%   BMI 60.36 kg/m   Physical Exam  Constitutional: She is oriented to person, place, and time. She appears well-developed and well-nourished. No distress.  HENT:  Head: Normocephalic and atraumatic.  Eyes: EOM are normal.  Neck: Normal range of motion.  Cardiovascular: Normal rate, regular rhythm and normal heart sounds.   Pulmonary/Chest: Effort normal and breath sounds  normal.  Abdominal: Soft. She exhibits no distension. There is no tenderness.  Musculoskeletal: Normal range of motion.  Neurological: She is alert and oriented to person, place, and time.  Skin: Skin is warm and dry.  Psychiatric: She has a normal mood and affect. Judgment normal.  Nursing note and vitals reviewed.   ED Treatments / Results  DIAGNOSTIC STUDIES:  Oxygen Saturation is 98% on RA, normal by my interpretation.    COORDINATION OF CARE:  3:08 AM Discussed treatment plan with pt at bedside and pt agreed to plan.   Labs (all labs ordered are listed, but only abnormal results are displayed) Labs Reviewed  RAPID STREP SCREEN (NOT AT [facility])  CULTURE, GROUP A STREP ([facility])  URINALYSIS, ROUTINE W REFLEX MICROSCOPIC  PREGNANCY, URINE    EKG  EKG Interpretation None      Radiology No results found.  Procedures Procedures (including critical care time)  Medications Ordered in ED Medications  acetaminophen (TYLENOL) tablet 650 mg (650 mg Oral Given 07/06/16 0008)   Initial Impression / Assessment and Plan / ED Course  I have reviewed the triage vital signs and the nursing notes.  Pertinent labs & imaging results that were available during my care of the patient were reviewed by me and considered in my medical decision making (see chart for details).  Clinical Course    Strep test is negative, mono test is negative, urinalysis and chest x-ray are clear. I highly suspect a viral etiology. Her symptoms could be consistent with influenza. She will be given Tamiflu and advised to take Tylenol and Motrin as needed for pain or fever.  Final Clinical Impressions(s) / ED Diagnoses   Final diagnoses:  None   New Prescriptions New Prescriptions   No medications on file  I personally performed the services described in this documentation, which was scribed in my presence. The recorded information has been reviewed and is accurate.        Geoffery Lyonsouglas Tamla Winkels, MD 07/07/16  [phone]

## 2016-07-08 LAB — CULTURE, GROUP A STREP (THRC)

## 2016-07-09 ENCOUNTER — Encounter (HOSPITAL_COMMUNITY): Payer: Self-pay | Admitting: Emergency Medicine

## 2016-07-09 ENCOUNTER — Encounter (HOSPITAL_COMMUNITY): Payer: Self-pay | Admitting: *Deleted

## 2016-07-09 ENCOUNTER — Emergency Department (HOSPITAL_COMMUNITY)
Admission: EM | Admit: 2016-07-09 | Discharge: 2016-07-10 | Disposition: A | Discharge status: Home / Self Care | Payer: BLUE CROSS/BLUE SHIELD | Attending: Emergency Medicine | Admitting: Emergency Medicine

## 2016-07-09 ENCOUNTER — Emergency Department (HOSPITAL_COMMUNITY)
Admission: EM | Admit: 2016-07-09 | Discharge: 2016-07-09 | Disposition: A | Discharge status: Home / Self Care | Payer: BLUE CROSS/BLUE SHIELD | Source: Home / Self Care | Attending: Emergency Medicine | Admitting: Emergency Medicine

## 2016-07-09 DIAGNOSIS — H1031 Unspecified acute conjunctivitis, right eye: Secondary | ICD-10-CM

## 2016-07-09 DIAGNOSIS — H66001 Acute suppurative otitis media without spontaneous rupture of ear drum, right ear: Secondary | ICD-10-CM

## 2016-07-09 DIAGNOSIS — H109 Unspecified conjunctivitis: Secondary | ICD-10-CM | POA: Insufficient documentation

## 2016-07-09 DIAGNOSIS — J45909 Unspecified asthma, uncomplicated: Secondary | ICD-10-CM

## 2016-07-09 DIAGNOSIS — H9201 Otalgia, right ear: Secondary | ICD-10-CM | POA: Diagnosis present

## 2016-07-09 DIAGNOSIS — Z79899 Other long term (current) drug therapy: Secondary | ICD-10-CM | POA: Insufficient documentation

## 2016-07-09 DIAGNOSIS — H6501 Acute serous otitis media, right ear: Secondary | ICD-10-CM | POA: Insufficient documentation

## 2016-07-09 DIAGNOSIS — F17210 Nicotine dependence, cigarettes, uncomplicated: Secondary | ICD-10-CM

## 2016-07-09 DIAGNOSIS — H6500 Acute serous otitis media, unspecified ear: Secondary | ICD-10-CM

## 2016-07-09 HISTORY — DX: Obesity, unspecified: E66.9

## 2016-07-09 MED ORDER — AMOXICILLIN-POT CLAVULANATE 875-125 MG PO TABS: 1.0000 | ORAL_TABLET | Freq: Two times a day (BID) | ORAL | 0 refills | Status: DC

## 2016-07-09 MED ORDER — ERYTHROMYCIN 5 MG/GM OP OINT
1.0000 "application " | TOPICAL_OINTMENT | Freq: Once | OPHTHALMIC | Status: AC
  Administered 2016-07-09: 1 via OPHTHALMIC
  Filled 2016-07-09: qty 3.5

## 2016-07-09 MED ORDER — AMOXICILLIN 500 MG PO CAPS
500.0000 mg | ORAL_CAPSULE | Freq: Once | ORAL | Status: AC
  Administered 2016-07-09: 500 mg via ORAL
  Filled 2016-07-09: qty 1

## 2016-07-09 MED ORDER — AMOXICILLIN 500 MG PO CAPS: 500.0000 mg | ORAL_CAPSULE | Freq: Two times a day (BID) | ORAL | 0 refills | Status: AC

## 2016-07-09 MED ORDER — OXYCODONE-ACETAMINOPHEN 5-325 MG PO TABS
1.0000 | ORAL_TABLET | Freq: Once | ORAL | Status: AC
  Administered 2016-07-09: 1 via ORAL
  Filled 2016-07-09: qty 1

## 2016-07-09 MED ORDER — AMOXICILLIN-POT CLAVULANATE 875-125 MG PO TABS
1.0000 | ORAL_TABLET | Freq: Once | ORAL | Status: AC
  Administered 2016-07-09: 1 via ORAL
  Filled 2016-07-09: qty 1

## 2016-07-09 MED ORDER — TRAMADOL HCL 50 MG PO TABS: 50.0000 mg | ORAL_TABLET | Freq: Four times a day (QID) | ORAL | 0 refills | Status: DC | PRN

## 2016-07-09 NOTE — Discharge Instructions (Signed)
Take the medication as directed. Do not take the narcotic if you are driving because it will make you sleepy. Use the eye ointment 3 times a day for the next week. Continue to take the ibuprofen in addition to the medications we give you tonight.I have given you the name and number of the ENT doctor if you want to follow up with him.

## 2016-07-09 NOTE — ED Provider Notes (Signed)
[facility]-EMERGENCY DEPT Provider Note   CSN: [id number] Arrival date & time: 07/09/16  2212     History   Chief Complaint Chief Complaint  Patient presents with  . Otalgia    HPI Michelle Pope is a 25 y.o. female who presents to the ED with right ear pain that she describes as severe. She reports having been to the ED earlier in the week and treated for possible flu with Tamiflu. [facility] She was treated with Amoxicillin and ibuprofen. Tonight the pain has become severe and she has redness and drainage to the right eye.   The history is provided by the patient. No language interpreter was used.  Otalgia  This is a new problem. The current episode started 12 to 24 hours ago. There is pain in the right ear. The problem occurs constantly. The problem has been rapidly worsening. There has been no fever. The pain is at a severity of 10/10. Associated symptoms include headaches, rhinorrhea, sore throat and cough. Pertinent negatives include no ear discharge, no abdominal pain, no vomiting, no neck pain and no rash.    Past Medical History:  Diagnosis Date  . Asthma   . Obesity     There are no active problems to display for this patient.   Past Surgical History:  Procedure Laterality Date  . WISDOM TOOTH EXTRACTION      OB History    No data available       Home Medications    Prior to Admission medications   Medication Sig Start Date End Date Taking? Authorizing Provider  albuterol (PROVENTIL HFA;VENTOLIN HFA) 108 (90 Base) MCG/ACT inhaler Inhale 1-2 puffs into the lungs every 6 (six) hours as needed for wheezing or shortness of breath. Patient not taking: Reported on 06/20/2016 03/13/16   Trixie DredgeEmily West, PA-C  amoxicillin (AMOXIL) 500 MG capsule Take 1 capsule (500 mg total) by mouth 2 (two) times daily. 07/09/16 07/16/16  Tomasita CrumbleAdeleke Oni, MD  amoxicillin-clavulanate (AUGMENTIN) 875-125 MG tablet Take 1 tablet by mouth 2 (two) times daily.  07/09/16   Kimiyah Blick Orlene OchM Ladislav Caselli, NP  dextromethorphan-guaiFENesin (MUCINEX DM) 30-600 MG 12hr tablet Take 1 tablet by mouth 2 (two) times daily as needed for cough. Patient not taking: Reported on 06/20/2016 03/13/16   Trixie DredgeEmily West, PA-C  HYDROcodone-acetaminophen [facility](NORCO) 5-325 MG tablet Take 1 tablet by mouth every 4 (four) hours as needed. 06/20/16   Mancel BaleElliott Wentz, MD  HYDROcodone-homatropine [facility](HYCODAN) 5-1.5 MG/5ML syrup Take 5 mLs by mouth every 6 (six) hours as needed for cough. Patient not taking: Reported on 06/20/2016 03/13/16   Trixie DredgeEmily West, PA-C  ibuprofen (ADVIL,MOTRIN) 800 MG tablet Take 1 tablet (800 mg total) by mouth every 8 (eight) hours as needed for mild pain or moderate pain. Patient not taking: Reported on 06/20/2016 03/13/16   Trixie DredgeEmily West, PA-C  naproxen (NAPROSYN) 500 MG tablet Take 1 tablet (500 mg total) by mouth 2 (two) times daily. Patient not taking: Reported on 06/20/2016 07/31/15   Ace GinsSerena Y Sam, PA-C  oseltamivir (TAMIFLU) 75 MG capsule Take 1 capsule (75 mg total) by mouth every 12 (twelve) hours. 07/06/16   Geoffery Lyonsouglas Delo, MD  predniSONE (DELTASONE) 20 MG tablet Take 2 tablets (40 mg total) by mouth daily. Patient not taking: Reported on 06/20/2016 07/31/15   Ace GinsSerena Y Sam, PA-C  traMADol (ULTRAM) 50 MG tablet Take 1 tablet (50 mg total) by mouth every 6 (six) hours as needed. 07/09/16   Jameya Pontiff Orlene OchM Dannell Raczkowski,  NP    Family History Family History  Problem Relation Age of Onset  . Hypertension Mother   . Diabetes Mother   . Hyperlipidemia Mother     Social History Social History  Substance Use Topics  . Smoking status: Current Some Day Smoker    Types: Cigarettes  . Smokeless tobacco: Never Used  . Alcohol use Yes     Comment: occasionally     Allergies   Patient has no known allergies.   Review of Systems Review of Systems  Constitutional: Negative for chills and fever.       Patient crying and appears very uncomfortable.   HENT: Positive for congestion, ear pain, rhinorrhea  and sore throat. Negative for ear discharge.   Eyes: Positive for discharge and redness.  Respiratory: Positive for cough. Negative for shortness of breath.   Cardiovascular: Negative for chest pain.  Gastrointestinal: Negative for abdominal pain, nausea and vomiting.  Genitourinary: Negative for dysuria.  Musculoskeletal: Negative for back pain, neck pain and neck stiffness.  Skin: Negative for rash.  Neurological: Positive for headaches. Negative for syncope.  Psychiatric/Behavioral: Negative for confusion. The patient is not nervous/anxious.      Physical Exam Updated Vital Signs BP 151/79 (BP Location: Left Arm)   Pulse 110   Temp 98.3 F (36.8 C) (Oral)   Resp 20   Ht 5\' 2"  (1.575 m)   Wt (!) 152.6 kg   LMP 06/24/2016   SpO2 100%   BMI 61.54 kg/m   Physical Exam  Constitutional: She is oriented to person, place, and time. No distress.  obese  HENT:  Head: Normocephalic.  Right Ear: Tympanic membrane is bulging.  Left Ear: Tympanic membrane normal.  Nose: Mucosal edema present.  Mouth/Throat: Uvula is midline and mucous membranes are normal. Posterior oropharyngeal erythema present.  Eyes: EOM are normal. Pupils are equal, round, and reactive to light. Right eye exhibits discharge. Right conjunctiva is injected.  Neck: Neck supple.  Cardiovascular: Normal rate and regular rhythm.   Pulmonary/Chest: Effort normal. No respiratory distress. She has no wheezes. She has no rales.  Abdominal: Soft. There is no tenderness.  Musculoskeletal: Normal range of motion.  Neurological: She is alert and oriented to person, place, and time. No cranial nerve deficit.  Skin: Skin is warm and dry.  Psychiatric: She has a normal mood and affect. Her behavior is normal.  Nursing note and vitals reviewed.    ED Treatments / Results  Labs (all labs ordered are listed, but only abnormal results are displayed) Labs Reviewed - No data to display  Radiology No results  found.  Procedures Procedures (including critical care time)  Medications Ordered in ED Medications  oxyCODONE-acetaminophen (PERCOCET/ROXICET) 5-325 MG per tablet 1 tablet (1 tablet Oral Given 07/09/16 2319)  amoxicillin-clavulanate (AUGMENTIN) 875-125 MG per tablet 1 tablet (1 tablet Oral Given 07/09/16 2350)  erythromycin ophthalmic ointment 1 application (1 application Right Eye Given 07/09/16 2350)     Initial Impression / Assessment and Plan / ED Course  I have reviewed the triage vital signs and the nursing notes.  Clinical Course   25 y.o. female with severe right ear pain and right eye redness and drainage stable for d/c without fever and does not appear toxic. Pain managed in the ED, will change antibiotic to Augmentin. Instructions to patient regarding need for f/u. Erythromycin Opth Ointment for conjunctivitis with first dose instilled prior to d/c .   Final Clinical Impressions(s) / ED Diagnoses   Final diagnoses:  Acute serous otitis media, recurrence not specified, unspecified laterality  Acute bacterial conjunctivitis of right eye    New Prescriptions Discharge Medication List as of 07/09/2016 11:36 PM    START taking these medications   Details  amoxicillin-clavulanate (AUGMENTIN) 875-125 MG tablet Take 1 tablet by mouth 2 (two) times daily., Starting Sat 07/09/2016, Print    traMADol (ULTRAM) 50 MG tablet Take 1 tablet (50 mg total) by mouth every 6 (six) hours as needed., Starting Sat 07/09/2016, Print         [place] Ruther[place], NP 07/10/16 0212    Melene Planan Floyd, DO 07/10/16 [phone]

## 2016-07-09 NOTE — ED Triage Notes (Signed)
Pt. reports persistent right earache onset last Friday with occasional drainage , [facility] prescribed with oral antibiotic with no improvement , denies fever or chills / no hearing loss.

## 2016-07-09 NOTE — ED Triage Notes (Signed)
Patient is alert and oriented x4.  She is complaining of right ear pain that started yesterday.  Currently she rates her pain 6 of 10 after taking ibuprofen 400 mg at home.

## 2016-07-09 NOTE — ED Provider Notes (Signed)
[facility]-EMERGENCY DEPT Provider Note   CSN: [id number] Arrival date & time: 07/09/16  0531     History   Chief Complaint Chief Complaint  Patient presents with  . Otalgia    right    HPI Michelle EmeryLolita T Breed is a 25 y.o. female with past medical history of asthma presenting today with right ear pain. Patient states she was seen here a couple of days ago and diagnosed with influenza. She was sent home with Tamiflu has been compliant with this. Last night around 10 PM she had sudden onset right ear pain. This is relieved by ibuprofen but she woke up at 4 AM with a similar pain again. She has some decreased hearing as well. She denies any fevers or drainage from the ear. 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 There are no further complaints.   10 Systems reviewed and are negative for acute change except as noted in the HPI.   HPI  Past Medical History:  Diagnosis Date  . Asthma     There are no active problems to display for this patient.   Past Surgical History:  Procedure Laterality Date  . WISDOM TOOTH EXTRACTION      OB History    No data available       Home Medications    Prior to Admission medications   Medication Sig Start Date End Date Taking? Authorizing Provider  albuterol (PROVENTIL HFA;VENTOLIN HFA) 108 (90 Base) MCG/ACT inhaler Inhale 1-2 puffs into the lungs every 6 (six) hours as needed for wheezing or shortness of breath. Patient not taking: Reported on 06/20/2016 03/13/16   Trixie DredgeEmily West, PA-C  amoxicillin (AMOXIL) 500 MG capsule Take 1 capsule (500 mg total) by mouth 2 (two) times daily. 07/09/16 07/16/16  Tomasita CrumbleAdeleke Jaylena Holloway, MD  dextromethorphan-guaiFENesin (MUCINEX DM) 30-600 MG 12hr tablet Take 1 tablet by mouth 2 (two) times daily as needed for cough. Patient not taking: Reported on 06/20/2016 03/13/16   Trixie DredgeEmily West, PA-C  HYDROcodone-acetaminophen [facility](NORCO) 5-325 MG tablet Take 1 tablet by mouth every 4 (four) hours as needed. 06/20/16   Mancel BaleElliott  Wentz, MD  HYDROcodone-homatropine [facility](HYCODAN) 5-1.5 MG/5ML syrup Take 5 mLs by mouth every 6 (six) hours as needed for cough. Patient not taking: Reported on 06/20/2016 03/13/16   Trixie DredgeEmily West, PA-C  ibuprofen (ADVIL,MOTRIN) 800 MG tablet Take 1 tablet (800 mg total) by mouth every 8 (eight) hours as needed for mild pain or moderate pain. Patient not taking: Reported on 06/20/2016 03/13/16   Trixie DredgeEmily West, PA-C  naproxen (NAPROSYN) 500 MG tablet Take 1 tablet (500 mg total) by mouth 2 (two) times daily. Patient not taking: Reported on 06/20/2016 07/31/15   Ace GinsSerena Y Sam, PA-C  oseltamivir (TAMIFLU) 75 MG capsule Take 1 capsule (75 mg total) by mouth every 12 (twelve) hours. 07/06/16   Geoffery Lyonsouglas Delo, MD  predniSONE (DELTASONE) 20 MG tablet Take 2 tablets (40 mg total) by mouth daily. Patient not taking: Reported on 06/20/2016 07/31/15   Carlene CoriaSerena Y Sam, PA-C    Family History Family History  Problem Relation Age of Onset  . Hypertension Mother   . Diabetes Mother   . Hyperlipidemia Mother     Social History Social History  Substance Use Topics  . Smoking status: Current Some Day Smoker    Types: Cigarettes  . Smokeless tobacco: Never Used  . Alcohol use Yes     Comment: occasionally     Allergies   Patient has no known allergies.  Review of Systems Review of Systems   Physical Exam Updated Vital Signs BP 149/75 (BP Location: Left Arm)   Pulse 88   Temp 98.2 F (36.8 C) (Oral)   Resp 18   Ht 5\' 2"  (1.575 m)   Wt (!) 330 lb (149.7 kg)   LMP 06/24/2016   SpO2 96%   BMI 60.36 kg/m   Physical Exam  Constitutional: She is oriented to person, place, and time. She appears well-developed and well-nourished. No distress.  HENT:  Head: Normocephalic and atraumatic.  Right Ear: External ear normal.  Left Ear: External ear normal.  Nose: Nose normal.  Mouth/Throat: Oropharynx is clear and moist. No oropharyngeal exudate.  Rhinorrhea Left TM is normal. Right TM has erythema and  purulence behind the membrane.  Eyes: Conjunctivae and EOM are normal. Pupils are equal, round, and reactive to light. No scleral icterus.  Neck: Normal range of motion. Neck supple. No JVD present. No tracheal deviation present. No thyromegaly present.  Cardiovascular: Normal rate, regular rhythm and normal heart sounds.  Exam reveals no gallop and no friction rub.   No murmur heard. Pulmonary/Chest: Effort normal and breath sounds normal. No respiratory distress. She has no wheezes. She exhibits no tenderness.  Abdominal: Soft. Bowel sounds are normal. She exhibits no distension and no mass. There is no tenderness. There is no rebound and no guarding.  Musculoskeletal: Normal range of motion. She exhibits no edema or tenderness.  Lymphadenopathy:    She has no cervical adenopathy.  Neurological: She is alert and oriented to person, place, and time. No cranial nerve deficit. She exhibits normal muscle tone.  Skin: Skin is warm and dry. No rash noted. No erythema. No pallor.  Nursing note and vitals reviewed.    ED Treatments / Results  Labs (all labs ordered are listed, but only abnormal results are displayed) Labs Reviewed - No data to display  EKG  EKG Interpretation None       Radiology No results found.  Procedures Procedures (including critical care time)  Medications Ordered in ED Medications  amoxicillin (AMOXIL) capsule 500 mg (not administered)     Initial Impression / Assessment and Plan / ED Course  I have reviewed the triage vital signs and the nursing notes.  Pertinent labs & imaging results that were available during my care of the patient were reviewed by me and considered in my medical decision making (see chart for details).  Clinical Course     Patient presents to the emergency department for right ear pain. She has an acute otitis media on the right side. This may be related to her viral syndrome, will treat with amoxicillin and advised her primary  care follow-up within one week. She is already on Tamiflu for possible influenza as well. She douches good understanding of the plan, she appears well in no acute distress, vital signs within her normal limits and she is safe for discharge.  Final Clinical Impressions(s) / ED Diagnoses   Final diagnoses:  Acute suppurative otitis media of right ear without spontaneous rupture of tympanic membrane, recurrence not specified    New Prescriptions New Prescriptions   AMOXICILLIN (AMOXIL) 500 MG CAPSULE    Take 1 capsule (500 mg total) by mouth 2 (two) times daily.     Tomasita CrumbleAdeleke Hamilton Marinello, MD 07/09/16 [phone]

## 2016-07-12 DIAGNOSIS — H6991 Unspecified Eustachian tube disorder, right ear: Secondary | ICD-10-CM | POA: Insufficient documentation

## 2016-07-12 DIAGNOSIS — H9211 Otorrhea, right ear: Secondary | ICD-10-CM | POA: Insufficient documentation

## 2016-07-12 DIAGNOSIS — H66011 Acute suppurative otitis media with spontaneous rupture of ear drum, right ear: Secondary | ICD-10-CM | POA: Insufficient documentation

## 2016-08-06 ENCOUNTER — Emergency Department (HOSPITAL_COMMUNITY): Payer: BLUE CROSS/BLUE SHIELD

## 2016-08-06 ENCOUNTER — Emergency Department (HOSPITAL_COMMUNITY)
Admission: EM | Admit: 2016-08-06 | Discharge: 2016-08-07 | Disposition: A | Discharge status: Home / Self Care | Payer: BLUE CROSS/BLUE SHIELD | Attending: Emergency Medicine | Admitting: Emergency Medicine

## 2016-08-06 ENCOUNTER — Encounter (HOSPITAL_COMMUNITY): Payer: Self-pay

## 2016-08-06 DIAGNOSIS — R05 Cough: Secondary | ICD-10-CM | POA: Diagnosis present

## 2016-08-06 DIAGNOSIS — J40 Bronchitis, not specified as acute or chronic: Secondary | ICD-10-CM | POA: Diagnosis not present

## 2016-08-06 DIAGNOSIS — Z79899 Other long term (current) drug therapy: Secondary | ICD-10-CM | POA: Insufficient documentation

## 2016-08-06 DIAGNOSIS — F17210 Nicotine dependence, cigarettes, uncomplicated: Secondary | ICD-10-CM | POA: Insufficient documentation

## 2016-08-06 NOTE — ED Provider Notes (Signed)
[facility]-EMERGENCY DEPT Provider Note   CSN: [id number] Arrival date & time: 08/06/16  2009    By signing my name below, I, Valentino Saxon, attest that this documentation has been prepared under the direction and in the presence of Kerrie Buffalo, NP. Electronically Signed: Valentino Saxon, ED Scribe. 08/06/16. 11:14 PM.  History   Chief Complaint Chief Complaint  Patient presents with  . Cough   The history is provided by the patient. No language interpreter was used.   HPI Comments: Michelle Pope is a 26 y.o. female who presents to the Emergency Department complaining of moderate, persistent cough onset three days ago. Per pt, she was seen in the ED for a right ear infection in 06/2016 and was prescribed Augmentin and told to f/u if symptoms worsened. She notes having a "wet" cough that began on her last visit but got much better until the past 3 days. Pt reports her cough became dry ~3 days ago. She notes having clear sputum following her cough. Pt states her cough is worsened at night. She reports associated sore throat and chest soreness. Pt states her CP is exacerbated with deep breathing and cough. She also reports associated fatigue. Pt notes taking Robitussin and OTC cough suppresents with no significant relief. Per pt, she followed up at [facility], Nose, Throat with Dr. Flo Shanks and was told to continue taking the Augmentin for her ear infection, which she finished as directed over a week ago. Denies fever, chills, nausea, vomiting, abdominal pain and SOB. Patient also reports having had conjunctivitis at her last visit which resolved with Erythromycin eye ointment.   Past Medical History:  Diagnosis Date  . Asthma   . Obesity     There are no active problems to display for this patient.  Past Surgical History:  Procedure Laterality Date  . WISDOM TOOTH EXTRACTION      OB History    No data available       Home Medications    Prior to Admission medications    Medication Sig Start Date End Date Taking? Authorizing Provider  albuterol (PROVENTIL HFA;VENTOLIN HFA) 108 (90 Base) MCG/ACT inhaler Inhale 1-2 puffs into the lungs every 6 (six) hours as needed for wheezing or shortness of breath. Patient not taking: Reported on 06/20/2016 03/13/16   Trixie Dredge, PA-C  azithromycin (ZITHROMAX Z-PAK) 250 MG tablet Take 2 tablets now and then one tablet PO daily. 08/07/16   Kermit Arnette Orlene Och, NP  HYDROcodone-homatropine (HYCODAN) 5-1.5 MG/5ML syrup Take 5 mLs by mouth every 6 (six) hours as needed for cough. 08/07/16   Chayden Garrelts Orlene Och, NP    Family History Family History  Problem Relation Age of Onset  . Hypertension Mother   . Diabetes Mother   . Hyperlipidemia Mother     Social History Social History  Substance Use Topics  . Smoking status: Current Every Day Smoker    Packs/day: 0.50    Types: Cigarettes  . Smokeless tobacco: Never Used  . Alcohol use Yes     Comment: occasionally     Allergies   Patient has no known allergies.   Review of Systems Review of Systems  Constitutional: Positive for fatigue. Negative for chills and fever.  HENT: Positive for congestion and sore throat. Negative for dental problem and trouble swallowing. Ear pain: off and on.   Eyes: Negative for pain, discharge and redness.  Respiratory: Positive for cough. Negative for shortness of breath.   Cardiovascular: Positive for chest pain.  Gastrointestinal: Negative for abdominal pain, nausea and vomiting.  Genitourinary: Negative for difficulty urinating.  Musculoskeletal: Negative for neck pain.  Skin: Negative for rash.  Neurological: Negative for headaches.  Psychiatric/Behavioral: Negative for confusion.     Physical Exam Updated Vital Signs BP 144/80 (BP Location: Left Arm) Comment (BP Location): forearm  Pulse 91   Temp 98.4 F (36.9 C) (Oral)   Resp 16   LMP 08/06/2016   SpO2 100%   Physical Exam  Constitutional: She appears well-developed and  well-nourished.  HENT:  Head: Normocephalic and atraumatic.  Right TM scarred with mild erythema. No landmarks. Uvula is midline. No edema or erythema.   Eyes: Conjunctivae are normal. Right eye exhibits no discharge. Left eye exhibits no discharge.  Cardiovascular: Normal rate, regular rhythm and normal heart sounds.   Pulmonary/Chest: Effort normal. No respiratory distress. She has no wheezes. She has no rales.  No wheezing or rales.   Neurological: She is alert. Coordination normal.  Skin: Skin is warm and dry. No rash noted. She is not diaphoretic. No erythema.  Psychiatric: She has a normal mood and affect.  Nursing note and vitals reviewed.    ED Treatments / Results   DIAGNOSTIC STUDIES: Oxygen Saturation is 100% on RA, normal by my interpretation.    COORDINATION OF CARE: 11:00 PM Discussed treatment plan with pt at bedside which includes labs and chest imaging and pt agreed to plan.  Radiology Dg Chest 2 View  Result Date: 08/06/2016 CLINICAL DATA:  Initial evaluation for acute cough. EXAM: CHEST  2 VIEW COMPARISON:  Prior radiograph from 10/09/2011. FINDINGS: The cardiac and mediastinal silhouettes are stable in size and contour, and remain within normal limits. The lungs are normally inflated. No airspace consolidation, pleural effusion, or pulmonary edema is identified. There is no pneumothorax. No acute osseous abnormality identified. IMPRESSION: No radiographic evidence for active cardiopulmonary disease. Electronically Signed   By: Rise MuBenjamin  McClintock M.D.   On: 08/06/2016 23:52    Procedures Procedures (including critical care time)  Medications Ordered in ED Medications  albuterol (PROVENTIL HFA;VENTOLIN HFA) 108 (90 Base) MCG/ACT inhaler 2 puff (not administered)     Initial Impression / Assessment and Plan / ED Course  I have reviewed the triage vital signs and the nursing notes.  Pertinent imaging results that were available during my care of the patient  were reviewed by me and considered in my medical decision making (see chart for details).    Final Clinical Impressions(s) / ED Diagnoses  26 y.o. female with cough that has worsened over the past 3 days and hx of recent otitis media stable for d/c without respiratory distress. O2 SAT 100% on R/A. Will treat for bronchitis and return precautions given.  Final diagnoses:  Bronchitis    New Prescriptions New Prescriptions   AZITHROMYCIN (ZITHROMAX Z-PAK) 250 MG TABLET    Take 2 tablets now and then one tablet PO daily.   HYDROCODONE-HOMATROPINE (HYCODAN) 5-1.5 MG/5ML SYRUP    Take 5 mLs by mouth every 6 (six) hours as needed for cough.   I personally performed the services described in this documentation, which was scribed in my presence. The recorded information has been reviewed and is accurate.     [address]Jaimey Franchini [place] Madalena Kesecker, NP 08/07/16 [id number]    Derwood KaplanAnkit Nanavati, MD 08/07/16 [phone]

## 2016-08-06 NOTE — ED Triage Notes (Signed)
Pt has had flu, ear infections in December, since then has had cough.  Cough worse in the past 3 days.  Now having body aches, sore throat.  Pt has tried OTC meds with no relief.  No respiratory difficulties.  Talking in complete sentences.  No one in household sick.  Cough interfering with sleep and usual activities.

## 2016-08-07 MED ORDER — ALBUTEROL SULFATE HFA 108 (90 BASE) MCG/ACT IN AERS: 2.0000 | INHALATION_SPRAY | RESPIRATORY_TRACT | Status: DC | PRN

## 2016-08-07 MED ORDER — HYDROCODONE-HOMATROPINE 5-1.5 MG/5ML PO SYRP: 5.0000 mL | ORAL_SOLUTION | Freq: Four times a day (QID) | ORAL | 0 refills | Status: DC | PRN

## 2016-08-07 MED ORDER — AZITHROMYCIN 250 MG PO TABS: ORAL_TABLET | ORAL | 0 refills | Status: DC

## 2016-08-07 MED ORDER — BENZONATATE 100 MG PO CAPS: 100.0000 mg | ORAL_CAPSULE | Freq: Three times a day (TID) | ORAL | 0 refills | Status: DC

## 2016-08-07 NOTE — Discharge Instructions (Signed)
Use the inhaler every 4 hours as needed. Take the medication for cough and the medication for infection as directed. Do not drive while taking the cough medication as it will make you sleepy. Return as needed for worsening symptoms.

## 2017-02-10 IMAGING — DX DG CHEST 2V
2 series · 2 of 2 positions shown · non-contrast
Comparison: Prior radiograph from 10/09/2011.

CLINICAL DATA: Initial evaluation for acute cough.

EXAM:
CHEST  2 VIEW

[chest pa]
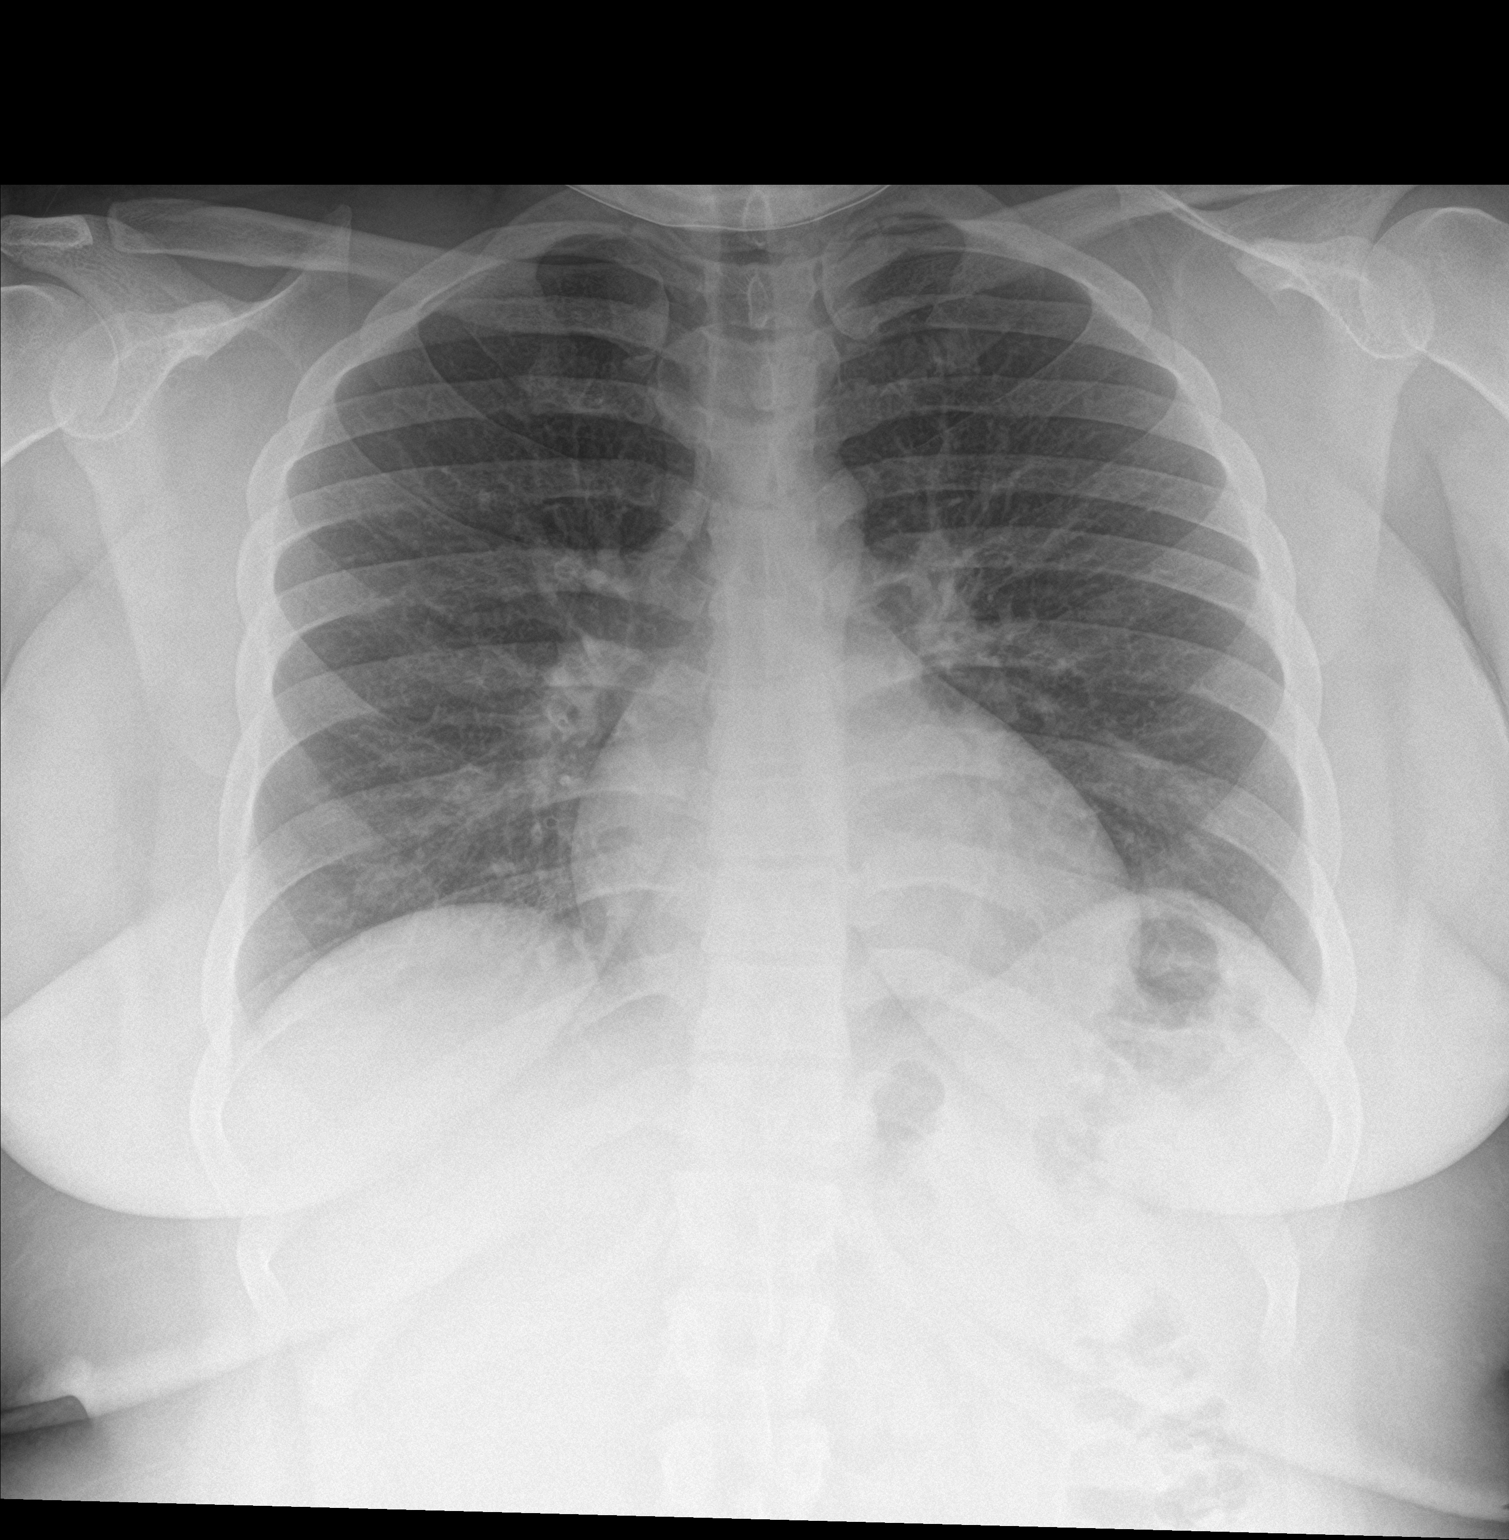

[chest lat]
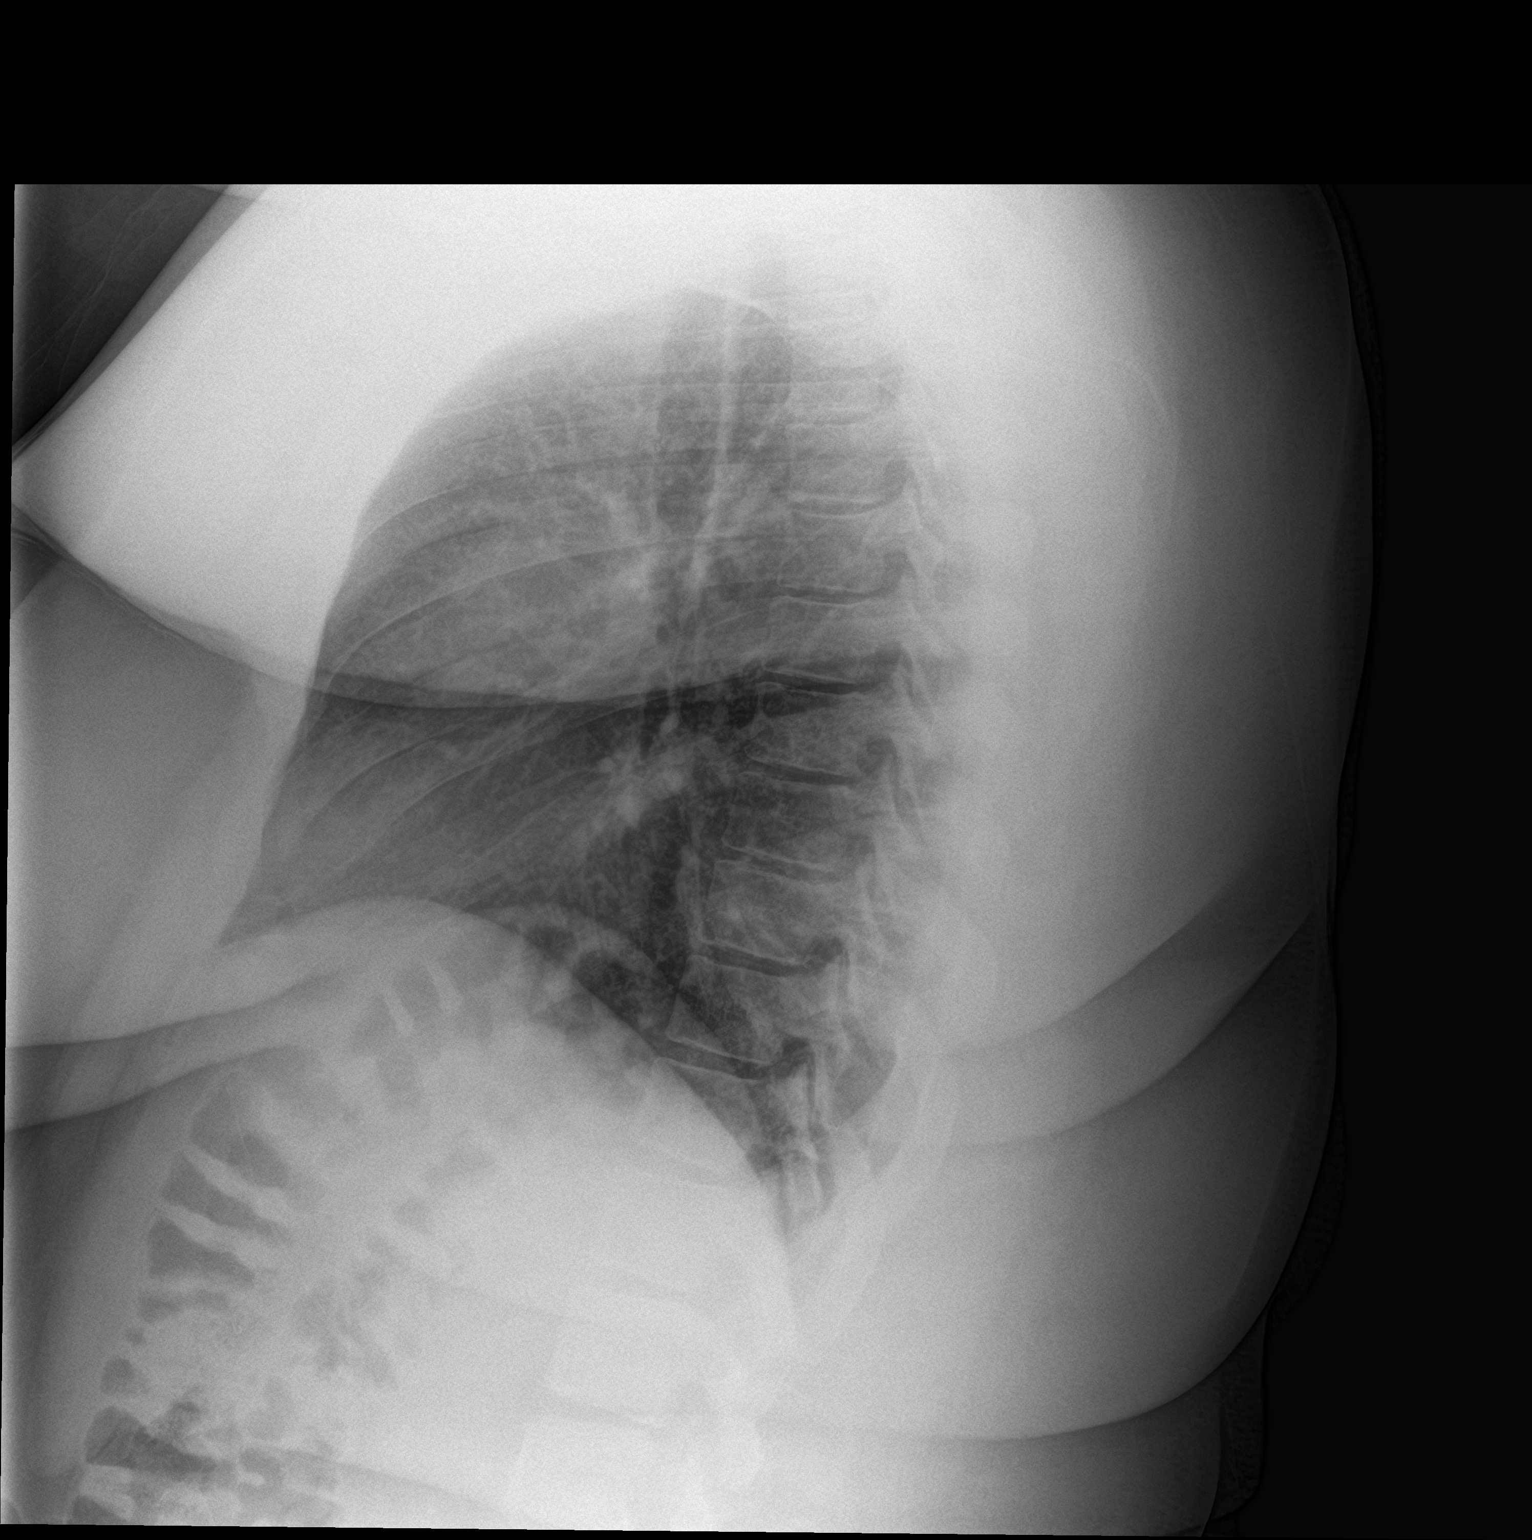

[2 of 2 positions shown; findings below may reference images not displayed]

FINDINGS: The cardiac and mediastinal silhouettes are stable in size and
contour, and remain within normal limits.

The lungs are normally inflated. No airspace consolidation, pleural
effusion, or pulmonary edema is identified. There is no
pneumothorax.

No acute osseous abnormality identified.
IMPRESSION: No radiographic evidence for active cardiopulmonary disease.

## 2017-03-03 ENCOUNTER — Other Ambulatory Visit: Payer: Self-pay | Admitting: Family Medicine

## 2017-03-03 ENCOUNTER — Other Ambulatory Visit (HOSPITAL_COMMUNITY)
Admission: RE | Admit: 2017-03-03 | Discharge: 2017-03-03 | Disposition: A | Discharge status: Home / Self Care | Payer: BLUE CROSS/BLUE SHIELD | Source: Ambulatory Visit | Attending: Family Medicine | Admitting: Family Medicine

## 2017-03-03 DIAGNOSIS — Z01419 Encounter for gynecological examination (general) (routine) without abnormal findings: Secondary | ICD-10-CM | POA: Insufficient documentation

## 2017-03-06 LAB — CYTOLOGY - PAP: Diagnosis: NEGATIVE

## 2017-08-18 ENCOUNTER — Ambulatory Visit
Admission: RE | Admit: 2017-08-18 | Discharge: 2017-08-18 | Disposition: A | Discharge status: Home / Self Care | Payer: Commercial Managed Care - PPO | Source: Ambulatory Visit | Attending: Physician Assistant | Admitting: Physician Assistant

## 2017-08-18 ENCOUNTER — Other Ambulatory Visit: Payer: Self-pay | Admitting: Physician Assistant

## 2017-08-18 DIAGNOSIS — M25512 Pain in left shoulder: Secondary | ICD-10-CM

## 2017-08-23 DIAGNOSIS — M25512 Pain in left shoulder: Secondary | ICD-10-CM | POA: Diagnosis not present

## 2017-08-31 ENCOUNTER — Encounter: Payer: Self-pay | Admitting: Obstetrics and Gynecology

## 2017-08-31 ENCOUNTER — Ambulatory Visit (INDEPENDENT_AMBULATORY_CARE_PROVIDER_SITE_OTHER): Payer: Commercial Managed Care - PPO | Admitting: Obstetrics and Gynecology

## 2017-08-31 VITALS — BP 122/88 | HR 78 | Wt 336.8 lb

## 2017-08-31 DIAGNOSIS — N926 Irregular menstruation, unspecified: Secondary | ICD-10-CM | POA: Diagnosis not present

## 2017-08-31 DIAGNOSIS — Z3202 Encounter for pregnancy test, result negative: Secondary | ICD-10-CM

## 2017-08-31 LAB — POCT URINE PREGNANCY: Preg Test, Ur: NEGATIVE

## 2017-08-31 MED ORDER — MEDROXYPROGESTERONE ACETATE 150 MG/ML IM SUSP: 150.0000 mg | INTRAMUSCULAR | 5 refills | Status: AC

## 2017-08-31 NOTE — Progress Notes (Signed)
27 yo G0 here for the evaluation of irregular vaginal bleeding. Patient with a long standing history of oligomenorrhea. She reports often skipping periods every 1-2 months. She has gone as long as 3 months without a period. She reports her bleeding lasting between 10-20 days. She is sexually active currently without contraception. She was previously on OCP. She is not actively seeking pregnancy at this time. She had a hard time remembering to take her pill daily. She denies any pelvic pain or abnormal discharge. She is having an HSV outbreak currently  Past Medical History:  Diagnosis Date  . Asthma   . HSV infection   . Obesity    Past Surgical History:  Procedure Laterality Date  . WISDOM TOOTH EXTRACTION     Family History  Problem Relation Age of Onset  . Hypertension Mother   . Diabetes Mother   . Hyperlipidemia Mother   . Congestive Heart Failure Mother   . Hypertension Maternal Grandmother   . Diabetes Maternal Grandmother   . Kidney disease Maternal Grandmother    Social History   Tobacco Use  . Smoking status: Current Every Day Smoker    Packs/day: 0.50    Types: Cigarettes  . Smokeless tobacco: Never Used  Substance Use Topics  . Alcohol use: Yes    Comment: occasionally  . Drug use: No   ROS See pertinent in HPI Blood pressure 122/88, pulse 78, weight (!) 336 lb 12.8 oz (152.8 kg), last menstrual period 08/12/2017. Last menstrual period 08/12/2017.  GENERAL: Well-developed, well-nourished female in no acute distress, Obese ABDOMEN: Soft, nontender, nondistended. Obese PELVIC: declined. EXTREMITIES: No cyanosis, clubbing, or edema, 2+ distal pulses.  A/P 27 yo with DUB - patient reports normal thyroid testing late last year - She reports being pre-diabetic and is not interested in starting medication - She has been actively trying to modify her diet and exercise regularly - She desires an update on her fertility. FSH and LH ordered. HSG also ordered - Pelvic  ultrasound ordered to assess any anatomical causes of DUB - Discussed medical management with contraception- patient agreed to start depo-provera and is aware of weight gain (due to increased appetite associated with this birth control). Patient had unprotected intercourse 3 days ago and will return for depo-provera in 2 weeks - patient will be contacted with results

## 2017-08-31 NOTE — Patient Instructions (Signed)
Polycystic Ovarian Syndrome Polycystic ovarian syndrome (PCOS) is a common hormonal disorder among women of reproductive age. In most women with PCOS, many small fluid-filled sacs (cysts) grow on the ovaries, and the cysts are not part of a normal menstrual cycle. PCOS can cause problems with your menstrual periods and make it difficult to get pregnant. It can also cause an increased risk of miscarriage with pregnancy. If it is not treated, PCOS can lead to serious health problems, such as diabetes and heart disease. What are the causes? The cause of PCOS is not known, but it may be the result of a combination of certain factors, such as:  Irregular menstrual cycle.  High levels of certain hormones (androgens).  Problems with the hormone that helps to control blood sugar (insulin resistance).  Certain genes.  What increases the risk? This condition is more likely to develop in women who have a family history of PCOS. What are the signs or symptoms? Symptoms of PCOS may include:  Multiple ovarian cysts.  Infrequent periods or no periods.  Periods that are too frequent or too heavy.  Unpredictable periods.  Inability to get pregnant (infertility) because of not ovulating.  Increased growth of hair on the face, chest, stomach, back, thumbs, thighs, or toes.  Acne or oily skin. Acne may develop during adulthood, and it may not respond to treatment.  Pelvic pain.  Weight gain or obesity.  Patches of thickened and dark brown or black skin on the neck, arms, breasts, or thighs (acanthosis nigricans).  Excess hair growth on the face, chest, abdomen, or upper thighs (hirsutism).  How is this diagnosed? This condition is diagnosed based on:  Your medical history.  A physical exam, including a pelvic exam. Your health care provider may look for areas of increased hair growth on your skin.  Tests, such as: ? Ultrasound. This may be used to examine the ovaries and the lining of the  uterus (endometrium) for cysts. ? Blood tests. These may be used to check levels of sugar (glucose), female hormone (testosterone), and female hormones (estrogen and progesterone) in your blood.  How is this treated? There is no cure for PCOS, but treatment can help to manage symptoms and prevent more health problems from developing. Treatment varies depending on:  Your symptoms.  Whether you want to have a baby or whether you need birth control (contraception).  Treatment may include nutrition and lifestyle changes along with:  Progesterone hormone to start a menstrual period.  Birth control pills to help you have regular menstrual periods.  Medicines to make you ovulate, if you want to get pregnant.  Medicine to reduce excessive hair growth.  Surgery, in severe cases. This may involve making small holes in one or both of your ovaries. This decreases the amount of testosterone that your body produces.  Follow these instructions at home:  Take over-the-counter and prescription medicines only as told by your health care provider.  Follow a healthy meal plan. This can help you reduce the effects of PCOS. ? Eat a healthy diet that includes lean proteins, complex carbohydrates, fresh fruits and vegetables, low-fat dairy products, and healthy fats. Make sure to eat enough fiber.  If you are overweight, lose weight as told by your health care provider. ? Losing 10% of your body weight may improve symptoms. ? Your health care provider can determine how much weight loss is best for you and can help you lose weight safely.  Keep all follow-up visits as told by   your health care provider. This is important. Contact a health care provider if:  Your symptoms do not get better with medicine.  You develop new symptoms. This information is not intended to replace advice given to you by your health care provider. Make sure you discuss any questions you have with your health care  provider. Document Released: 10/21/2004 Document Revised: 02/23/2016 Document Reviewed: 12/13/2015 Elsevier Interactive Patient Education  2018 Elsevier Inc.  Diet for Polycystic Ovarian Syndrome Polycystic ovary syndrome (PCOS) is a disorder of the chemical messengers (hormones) that regulate menstruation. The condition causes important hormones to be out of balance. PCOS can:  Make your periods irregular or stop.  Cause cysts to develop on the ovaries.  Make it difficult to get pregnant.  Stop your body from responding to the effects of insulin (insulin resistance), which can lead to obesity and diabetes.  Changing what you eat can help manage PCOS and improve your health. It can help you lose weight and improve the way your body uses insulin. What is my plan?  Eat breakfast, lunch, and dinner plus two snacks every day.  Include protein in each meal and snack.  Choose whole grains instead of products made with refined flour.  Eat a variety of foods.  Exercise regularly as told by your health care provider. What do I need to know about this eating plan? If you are overweight or obese, pay attention to how many calories you eat. Cutting down on calories can help you lose weight. Work with your health care provider or dietitian to figure out how many calories you need each day. What foods can I eat? Grains Whole grains, such as whole wheat. Whole-grain breads, crackers, cereals, and pasta. Unsweetened oatmeal, bulgur, barley, quinoa, or brown rice. Corn or whole-wheat flour tortillas. Vegetables  Lettuce. Spinach. Peas. Beets. Cauliflower. Cabbage. Broccoli. Carrots. Tomatoes. Squash. Eggplant. Herbs. Peppers. Onions. Cucumbers. Brussels sprouts. Fruits Berries. Bananas. Apples. Oranges. Grapes. Papaya. Mango. Pomegranate. Kiwi. Grapefruit. Cherries. Meats and Other Protein Sources Lean proteins, such as fish, chicken, beans, eggs, and tofu. Dairy Low-fat dairy products, such  as skim milk, cheese sticks, and yogurt. Beverages Low-fat or fat-free drinks, such as water, low-fat milk, sugar-free drinks, and 100% fruit juice. Condiments Ketchup. Mustard. Barbecue sauce. Relish. Low-fat or fat-free mayonnaise. Fats and Oils Olive oil or canola oil. Walnuts and almonds. The items listed above may not be a complete list of recommended foods or beverages. Contact your dietitian for more options. What foods are not recommended? Foods high in calories or fat. Fried foods. Sweets. Products made from refined white flour, including white bread, pastries, white rice, and pasta. The items listed above may not be a complete list of foods and beverages to avoid. Contact your dietitian for more information. This information is not intended to replace advice given to you by your health care provider. Make sure you discuss any questions you have with your health care provider. Document Released: 10/19/2015 Document Revised: 12/03/2015 Document Reviewed: 07/09/2014 Elsevier Interactive Patient Education  2018 Elsevier Inc.  

## 2017-09-01 LAB — FOLLICLE STIMULATING HORMONE: FSH: 6.6 m[IU]/mL

## 2017-09-01 LAB — LUTEINIZING HORMONE: LH: 6.3 m[IU]/mL

## 2017-09-19 ENCOUNTER — Telehealth: Payer: Self-pay | Admitting: *Deleted

## 2017-09-19 NOTE — Telephone Encounter (Signed)
Pt called to office with questions about u/s, last visit.  Attempt to return call.  LM on VM to call back.

## 2017-09-21 ENCOUNTER — Ambulatory Visit
Admission: RE | Admit: 2017-09-21 | Discharge: 2017-09-21 | Disposition: A | Discharge status: Home / Self Care | Payer: Commercial Managed Care - PPO | Source: Ambulatory Visit | Attending: Obstetrics and Gynecology | Admitting: Obstetrics and Gynecology

## 2017-09-21 ENCOUNTER — Telehealth: Payer: Self-pay | Admitting: Pediatrics

## 2017-09-21 DIAGNOSIS — N926 Irregular menstruation, unspecified: Secondary | ICD-10-CM

## 2017-09-21 DIAGNOSIS — N939 Abnormal uterine and vaginal bleeding, unspecified: Secondary | ICD-10-CM | POA: Diagnosis not present

## 2017-09-21 NOTE — Telephone Encounter (Signed)
[facility]  [facility] states she was supposed to contact her in regards to US appointment.  Aneetra on other line-msg forwarded.

## 2017-09-22 ENCOUNTER — Ambulatory Visit (INDEPENDENT_AMBULATORY_CARE_PROVIDER_SITE_OTHER): Payer: Commercial Managed Care - PPO

## 2017-09-22 DIAGNOSIS — Z3042 Encounter for surveillance of injectable contraceptive: Secondary | ICD-10-CM | POA: Diagnosis not present

## 2017-09-22 DIAGNOSIS — Z3202 Encounter for pregnancy test, result negative: Secondary | ICD-10-CM

## 2017-09-22 LAB — POCT URINE PREGNANCY: Preg Test, Ur: NEGATIVE

## 2017-09-22 MED ORDER — MEDROXYPROGESTERONE ACETATE 150 MG/ML IM SUSP
150.0000 mg | Freq: Once | INTRAMUSCULAR | Status: AC
  Administered 2017-09-22: 150 mg via INTRAMUSCULAR

## 2017-09-22 NOTE — Progress Notes (Signed)
I have reviewed the chart and agree with nursing staff's documentation of this patient's encounter.  Sharyon CableVeronica C Osmany Azer, CNM 09/22/2017 11:10 AM

## 2017-09-22 NOTE — Progress Notes (Signed)
Pt here for a depo restart. Pt had first upt 2/21. She is outside of her 2 week window for second upt. Pt states that she has not had ic since 1 week before her last visit. Okay to give depo with negative upt per provider. Inj given in left deltoid. Pt tolerated well. Next depo due 5/31-6/14.

## 2017-09-24 ENCOUNTER — Encounter: Payer: Self-pay | Admitting: Obstetrics and Gynecology

## 2017-10-05 DIAGNOSIS — R7303 Prediabetes: Secondary | ICD-10-CM | POA: Diagnosis not present

## 2017-10-18 ENCOUNTER — Encounter: Payer: Self-pay | Admitting: Obstetrics and Gynecology

## 2017-11-07 ENCOUNTER — Emergency Department (HOSPITAL_COMMUNITY): Payer: Commercial Managed Care - PPO

## 2017-11-07 ENCOUNTER — Emergency Department (HOSPITAL_COMMUNITY)
Admission: EM | Admit: 2017-11-07 | Discharge: 2017-11-07 | Disposition: A | Discharge status: Home / Self Care | Payer: Commercial Managed Care - PPO | Attending: Emergency Medicine | Admitting: Emergency Medicine

## 2017-11-07 ENCOUNTER — Encounter (HOSPITAL_COMMUNITY): Payer: Self-pay | Admitting: Emergency Medicine

## 2017-11-07 DIAGNOSIS — Z79899 Other long term (current) drug therapy: Secondary | ICD-10-CM | POA: Insufficient documentation

## 2017-11-07 DIAGNOSIS — M79672 Pain in left foot: Secondary | ICD-10-CM | POA: Diagnosis not present

## 2017-11-07 DIAGNOSIS — M79675 Pain in left toe(s): Secondary | ICD-10-CM | POA: Diagnosis not present

## 2017-11-07 DIAGNOSIS — F17210 Nicotine dependence, cigarettes, uncomplicated: Secondary | ICD-10-CM | POA: Insufficient documentation

## 2017-11-07 DIAGNOSIS — J45909 Unspecified asthma, uncomplicated: Secondary | ICD-10-CM | POA: Diagnosis not present

## 2017-11-07 MED ORDER — IBUPROFEN 600 MG PO TABS: 600.0000 mg | ORAL_TABLET | Freq: Three times a day (TID) | ORAL | 0 refills | Status: DC | PRN

## 2017-11-07 MED ORDER — IBUPROFEN 200 MG PO TABS
600.0000 mg | ORAL_TABLET | Freq: Once | ORAL | Status: AC
  Administered 2017-11-07: 600 mg via ORAL
  Filled 2017-11-07: qty 3

## 2017-11-07 NOTE — Discharge Instructions (Addendum)
It was our pleasure to provide your ER care today - we hope that you feel better.  As weight bearing/walking aggravates your pain, stay off foot for the next couple of days. You may try cold/icepack to sore area. Take motrin as need for pain.   Follow up with primary care doctor, and/or podiatrist in 1 week if symptoms fail to improve/resolve.  Your blood pressure is high today - follow up with primary care doctor in 1-2 weeks.   Return to ER if worse, new symptoms, fevers, increased redness/swelling, other concern.

## 2017-11-07 NOTE — ED Triage Notes (Signed)
Pt c/o left foot pain since yesterday. Reports this morning pains worse with weight bearing. Pt is on bottom behind big toe. Pain is worse moving toe. Denies injuring it that she can remember.

## 2017-11-07 NOTE — ED Notes (Signed)
Bed: [facility] Expected date:  Expected time:  Means of arrival:  Comments: 

## 2017-11-07 NOTE — ED Provider Notes (Signed)
[facility]-EMERGENCY DEPT Provider Note   CSN: [id number] Arrival date & time: 11/07/17  4098     History   Chief Complaint Chief Complaint  Patient presents with  . Foot Pain    HPI Michelle Pope is a 27 y.o. female.  Patient c/o pain to base of left great toe especially on plantar aspect for the past day. Pain is acute onset,  constant, dull, moderate, worse w weight bearing and walking. Denies specific injury to area. No numbness to toe. No redness to area. No fever/chills. Denies hx same symptoms. No hx gout. No swelling to foot or leg.   The history is provided by the patient.  Foot Pain     Past Medical History:  Diagnosis Date  . Asthma   . HSV infection   . Obesity     There are no active problems to display for this patient.   Past Surgical History:  Procedure Laterality Date  . WISDOM TOOTH EXTRACTION       OB History    Gravida  0   Para  0   Term  0   Preterm  0   AB  0   Living  0     SAB  0   TAB  0   Ectopic  0   Multiple  0   Live Births  0            Home Medications    Prior to Admission medications   Medication Sig Start Date End Date Taking? Authorizing Provider  albuterol (PROVENTIL HFA;VENTOLIN HFA) 108 (90 Base) MCG/ACT inhaler Inhale 1-2 puffs into the lungs every 6 (six) hours as needed for wheezing or shortness of breath. Patient not taking: Reported on 06/20/2016 03/13/16   Trixie Dredge, PA-C  medroxyPROGESTERone (DEPO-PROVERA) 150 MG/ML injection Inject 1 mL (150 mg total) into the muscle every 3 (three) months. 08/31/17   Constant, Gigi Gin, MD    Family History Family History  Problem Relation Age of Onset  . Hypertension Mother   . Diabetes Mother   . Hyperlipidemia Mother   . Congestive Heart Failure Mother   . Hypertension Maternal Grandmother   . Diabetes Maternal Grandmother   . Kidney disease Maternal Grandmother     Social History Social History   Tobacco Use  .  Smoking status: Current Every Day Smoker    Packs/day: 0.50    Types: Cigarettes  . Smokeless tobacco: Never Used  Substance Use Topics  . Alcohol use: Yes    Comment: occasionally  . Drug use: No     Allergies   Patient has no known allergies.   Review of Systems Review of Systems  Constitutional: Negative for fever.  Cardiovascular: Negative for leg swelling.  Musculoskeletal:       Pain localized to base left great toe. No other joint pain.   Skin: Negative for rash.     Physical Exam Updated Vital Signs BP (!) 164/82 (BP Location: Left Arm)   Pulse 73   Temp 98 F (36.7 C) (Oral)   Resp 18   Ht 1.588 m (5' 2.5")   LMP 10/17/2017   SpO2 100%   BMI 60.62 kg/m   Physical Exam  Constitutional: She appears well-developed and well-nourished. No distress.  HENT:  Mouth/Throat: Oropharynx is clear and moist.  Eyes: Conjunctivae are normal. No scleral icterus.  Neck: Neck supple. No tracheal deviation present.  Cardiovascular: Intact distal pulses.  Pulmonary/Chest: Effort normal. No respiratory  distress.  Abdominal: Normal appearance.  Musculoskeletal: She exhibits no edema.  Tenderness to plantar aspect base of left great toe. No erythema or increased warmth to area. No pain w passive rom digits. Normal cap refill distally in toes. Dp/pt palp. No skin lesions noted. No ankle, leg or calf swelling.   Neurological: She is alert.  Skin: Skin is warm and dry. No rash noted. She is not diaphoretic.  Psychiatric: She has a normal mood and affect.  Nursing note and vitals reviewed.    ED Treatments / Results  Labs (all labs ordered are listed, but only abnormal results are displayed) Labs Reviewed - No data to display  EKG None  Radiology Dg Toe Great Left  Result Date: 11/07/2017 CLINICAL DATA:  Pain at first MTP joint level. EXAM: LEFT FIRST TOE: 3 V COMPARISON:  None. FINDINGS: Frontal, oblique, and lateral views were obtained. There is no fracture or  dislocation. Joint spaces appear normal. No erosive change or soft tissue swelling. No intra-articular calcification. IMPRESSION: No fracture or dislocation.  No evident arthropathy. Electronically Signed   By: Bretta Bang III M.D.   On: 11/07/2017 08:47    Procedures Procedures (including critical care time)  Medications Ordered in ED Medications - No data to display   Initial Impression / Assessment and Plan / ED Course  I have reviewed the triage vital signs and the nursing notes.  Pertinent labs & imaging results that were available during my care of the patient were reviewed by me and considered in my medical decision making (see chart for details).  Xrays.  Reviewed nursing notes and prior charts for additional history.   xrays reviewed - no fx. Discussed  w pt.   No sign of infection on exam. ?ligament strain vs tendonitis vs arthritis.   Motrin po.  rec outpt f/u.   Final Clinical Impressions(s) / ED Diagnoses   Final diagnoses:  None    ED Discharge Orders    None       Cathren Laine, MD 11/07/17 [phone]

## 2017-12-08 ENCOUNTER — Ambulatory Visit (INDEPENDENT_AMBULATORY_CARE_PROVIDER_SITE_OTHER): Payer: Commercial Managed Care - PPO

## 2017-12-08 DIAGNOSIS — Z3042 Encounter for surveillance of injectable contraceptive: Secondary | ICD-10-CM | POA: Diagnosis not present

## 2017-12-08 MED ORDER — MEDROXYPROGESTERONE ACETATE 150 MG/ML IM SUSP
150.0000 mg | INTRAMUSCULAR | Status: AC
  Administered 2017-12-08: 150 mg via INTRAMUSCULAR

## 2017-12-08 NOTE — Progress Notes (Signed)
Patient is in the office for depo, administered in R deltoid and pt tolerated well .Marland Kitchen. Administrations This Visit    medroxyPROGESTERone (DEPO-PROVERA) injection 150 mg    Admin Date 12/08/2017 Action Given Dose 150 mg Route Intramuscular Administered By Katrina StackStalling, Brittany D, RN

## 2018-02-22 IMAGING — CR DG SHOULDER 2+V*L*
3 series · 3 of 3 positions shown · non-contrast
Comparison: None.

CLINICAL DATA: Left shoulder pain for 2 weeks. The joint pops in
and out.

EXAM:
LEFT SHOULDER - 2+ VIEW

[w shoulder ap internal left *]
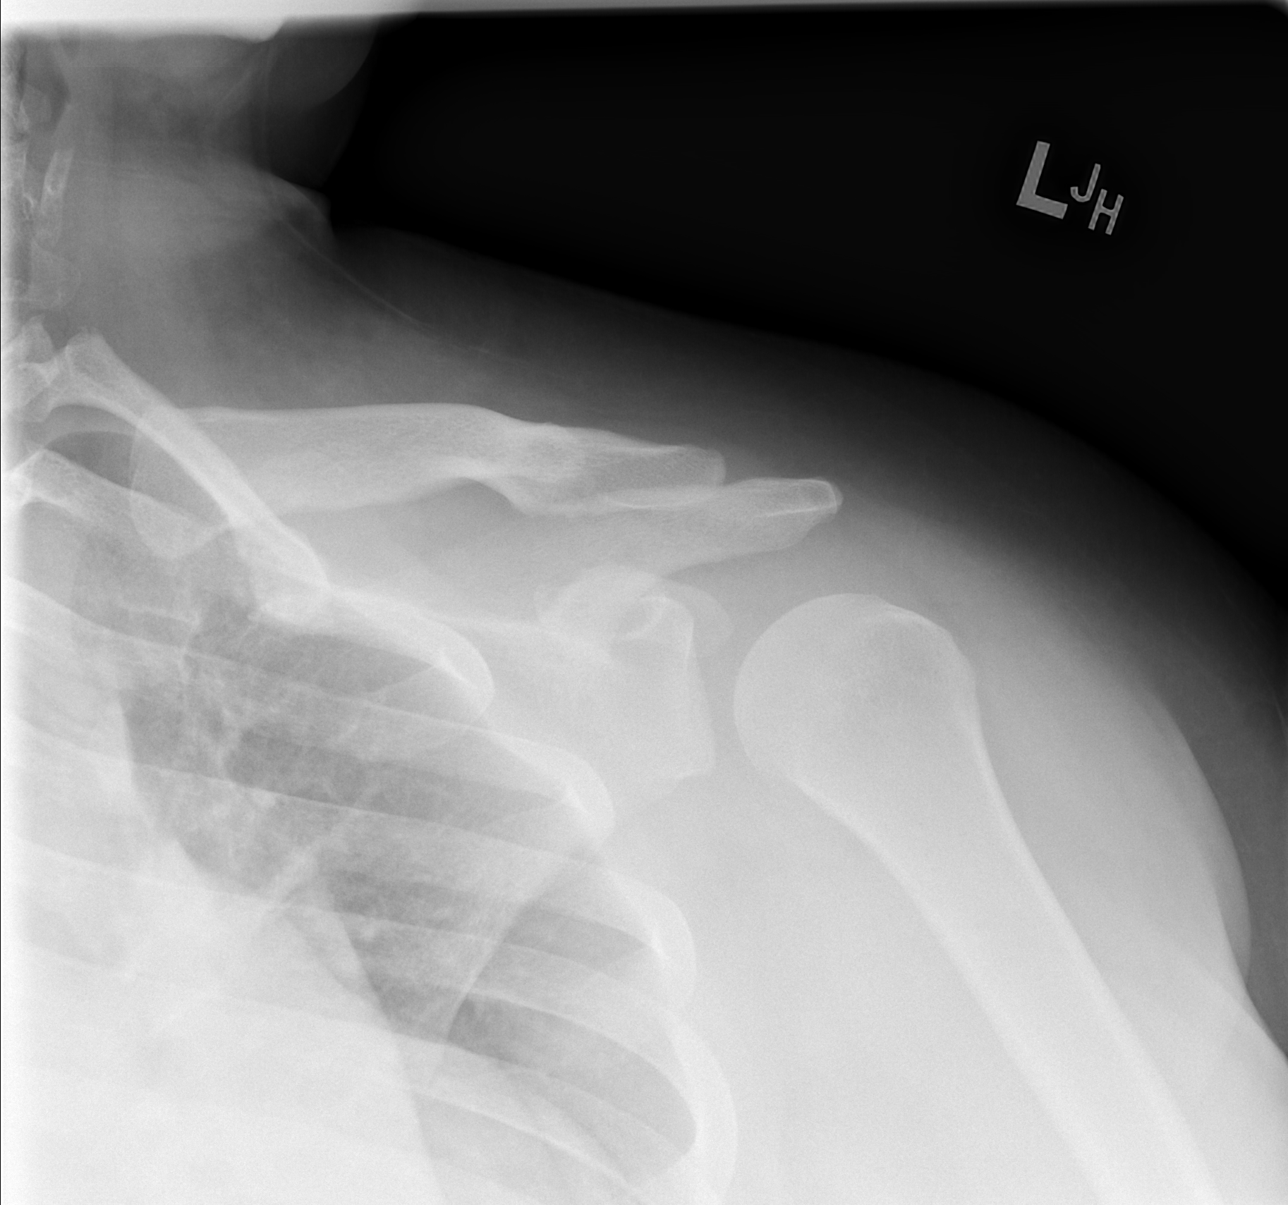

[w shoulder y view left *]
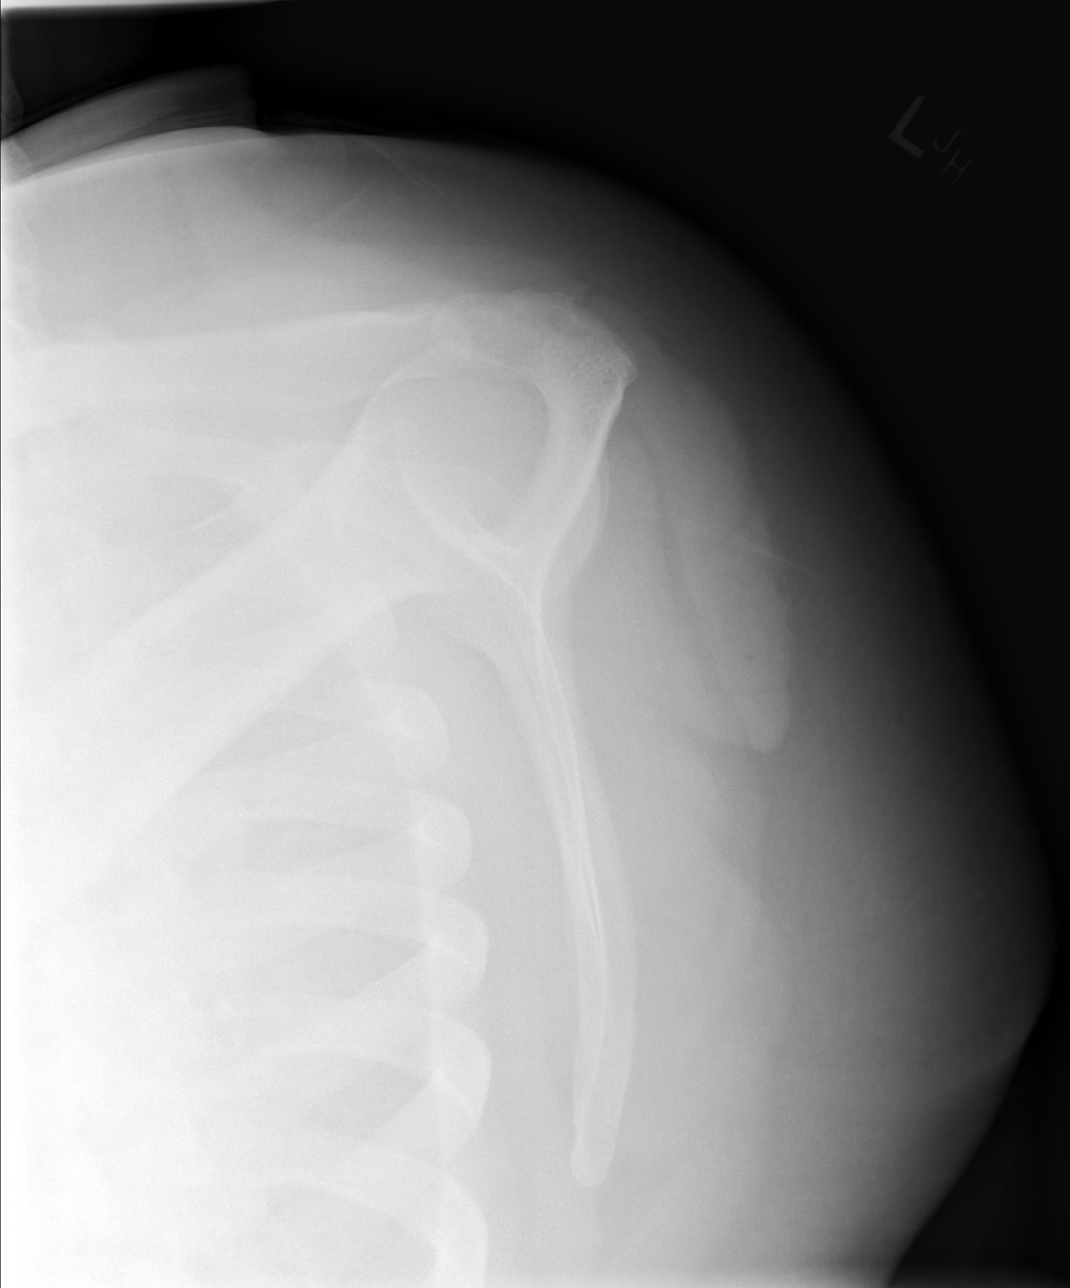

[x shoulder axillary left *]
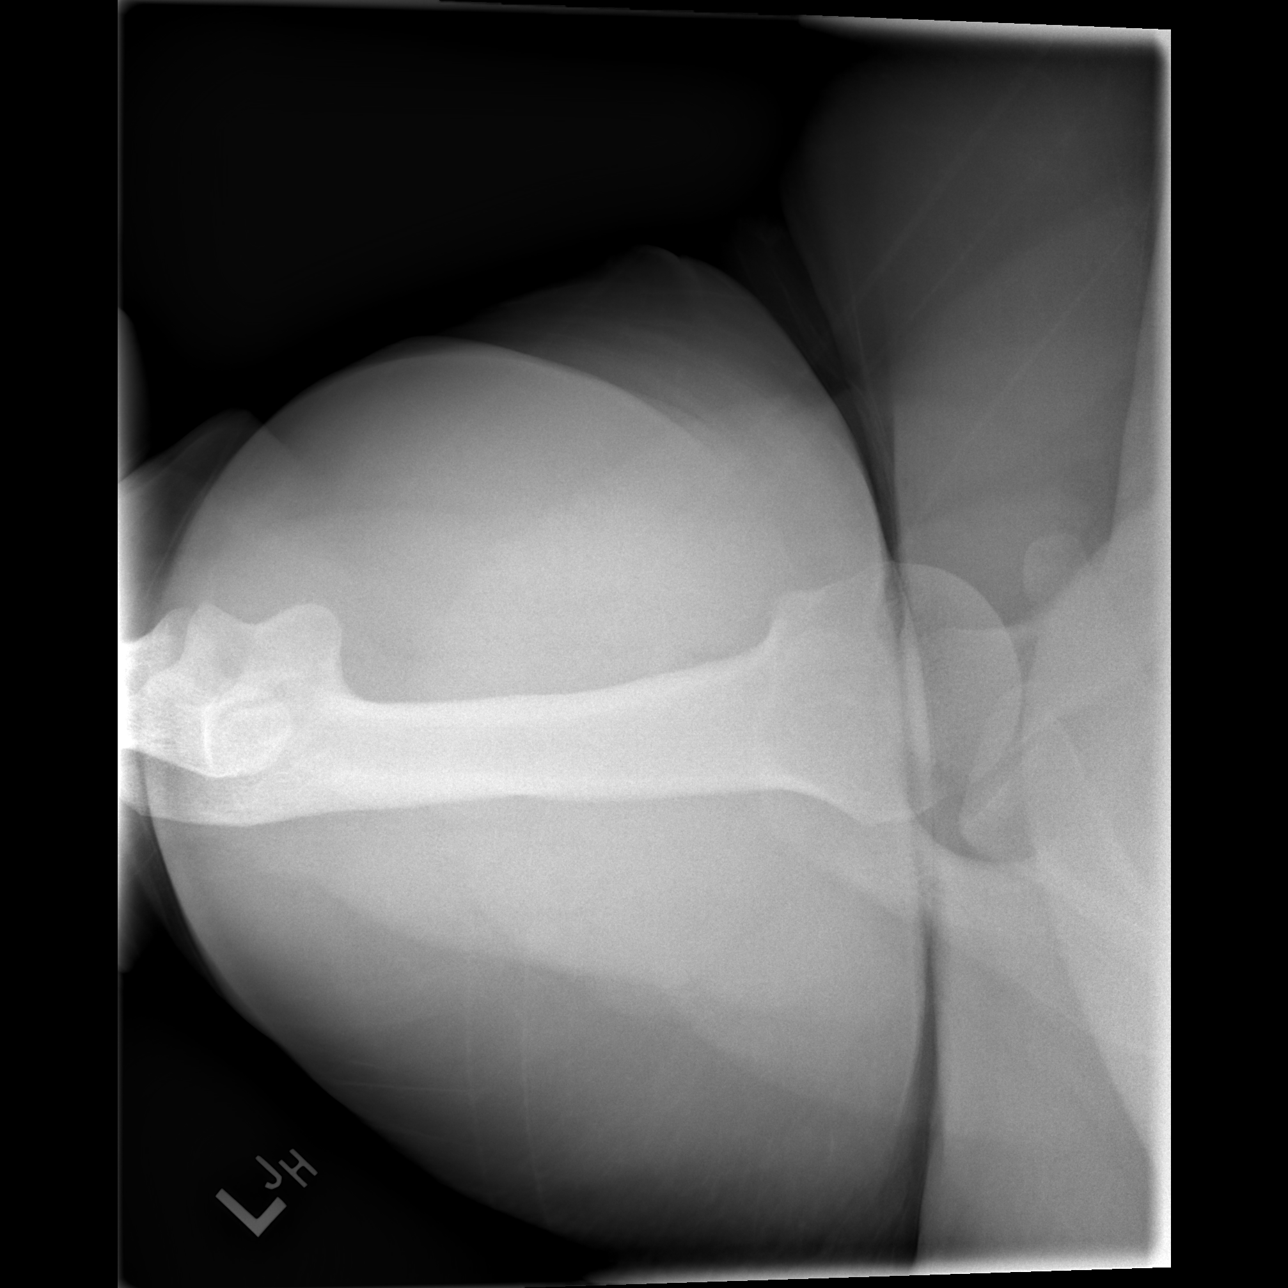

[3 of 3 positions shown; findings below may reference images not displayed]

FINDINGS: There is no evidence of fracture or dislocation. There is no
evidence of arthropathy or other focal bone abnormality. Soft
tissues are unremarkable.
IMPRESSION: Negative.

## 2018-03-01 ENCOUNTER — Ambulatory Visit: Payer: Commercial Managed Care - PPO

## 2018-03-13 DIAGNOSIS — R7303 Prediabetes: Secondary | ICD-10-CM | POA: Diagnosis not present

## 2018-03-13 DIAGNOSIS — Z13220 Encounter for screening for lipoid disorders: Secondary | ICD-10-CM | POA: Diagnosis not present

## 2018-03-13 DIAGNOSIS — Z8249 Family history of ischemic heart disease and other diseases of the circulatory system: Secondary | ICD-10-CM | POA: Diagnosis not present

## 2018-03-13 DIAGNOSIS — Z0000 Encounter for general adult medical examination without abnormal findings: Secondary | ICD-10-CM | POA: Diagnosis not present

## 2018-04-09 ENCOUNTER — Encounter: Payer: Self-pay | Admitting: Registered"

## 2018-04-09 ENCOUNTER — Encounter: Payer: Commercial Managed Care - PPO | Attending: Family Medicine | Admitting: Registered"

## 2018-04-09 DIAGNOSIS — Z713 Dietary counseling and surveillance: Secondary | ICD-10-CM | POA: Insufficient documentation

## 2018-04-09 DIAGNOSIS — R7303 Prediabetes: Secondary | ICD-10-CM | POA: Diagnosis present

## 2018-04-09 NOTE — Patient Instructions (Addendum)
-   Prepare breakfast the night before to reduce eating fast food breakfast.   - Use My Plate handout as guide for meals.  - Increase vegetable intake.  - Use midday break to plan dinner and add in physical activity.   - Drink shake instead of skipping dinner.

## 2018-04-09 NOTE — Progress Notes (Signed)
  Medical Nutrition Therapy:  Appt start time: 11:15 end time:  12:27.   Assessment:  Primary concerns today: Pt states she is referred to improve her health. Per referral, recent A1c is 6.4.   Pt expectations: wants to know what to eat  Pt states she changed her eating habits once and A1c improved then changed them again and A1c increased. Pt states she gets in her own head. Pt states she needs to be more discipline and to know exactly what to eat. Pt states she wants to lose weight because she is unhealthy and wants to prevent health risks.   Pt states she eats out a lot and does not make the time to cook. Pt states she works M-F 8 hrs/day split shift. Pt states she tries not to eat late because she tries to go to bed in or der to get 8 hours sleep/night. Pt states her work schedule varies a little when its time to get off work. 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   Pt states she reads food labels and checks for sodium content.    Preferred Learning Style:   No preference indicated   Learning Readiness:   Ready  Change in progress   MEDICATIONS: See list   DIETARY INTAKE:  Usual eating pattern includes 2 meals and 1 snacks per day.  Everyday foods include fast food and convenience foods.  Avoided foods include chitterlings and pig feet.    24-hr recall:  B ( AM): [place]-latte + potato wedges Snk (10 AM): BLT biscuit  L (1 PM): fast food-fried chicken/double cheeseburger + fries + cole slaw  Snk ( PM): sometimes chips D ( PM): typically skips Snk ( PM): lemonade or sweet tea Beverages: water, latte, lemonade, sweet tea, soda, caramel frappe (3-4 times/week)  Usual physical activity: ADLs  Estimated energy needs: 2000 calories 225 g carbohydrates 150 g protein 56 g fat  Progress Towards Goal(s):  In progress.   Nutritional Diagnosis:  NB-1.1 Food and nutrition-related knowledge deficit As  related to prediabetes.  As evidenced by pt report of inaccurate prediabetes and diabetes information.    Intervention:  Nutrition education and counseling. Pt was educated and counseled on prediabetes, risk factors, importance of increasing fiber intake and physical activity. Pt was in agreement with goals listed.  Goals: - Prepare breakfast the night before to reduce eating fast food breakfast.  - Use My Plate handout as guide for meals. - Increase vegetable intake. - Use midday break to plan dinner and add in physical activity.  - Drink shake instead of skipping dinner.   Teaching Method Utilized:  Visual Auditory Hands on  Handouts given during visit include:  My Plate  Barriers to learning/adherence to lifestyle change: work-life balance  Demonstrated degree of understanding via:  Teach Back   Monitoring/Evaluation:  Dietary intake, exercise, and body weight prn.

## 2018-05-15 ENCOUNTER — Ambulatory Visit: Payer: Commercial Managed Care - PPO | Admitting: Registered"

## 2019-07-01 IMAGING — US US PELVIS COMPLETE TRANSABD/TRANSVAG
1 series · 14 of 25 positions shown · non-contrast
Comparison: 06/20/2016

CLINICAL DATA: Dysfunctional uterine bleeding.

EXAM:
TRANSABDOMINAL AND TRANSVAGINAL ULTRASOUND OF PELVIS
TECHNIQUE: Both transabdominal and transvaginal ultrasound examinations of the
pelvis were performed. Transabdominal technique was performed for
global imaging of the pelvis including uterus, ovaries, adnexal
regions, and pelvic cul-de-sac. It was necessary to proceed with
endovaginal exam following the transabdominal exam to visualize the
endometrial stripe and ovaries.

[Series 1: us pelvis complete transabd/transvag · 0.25mm/px · 14 of 72 slices shown]
[im 1/72]
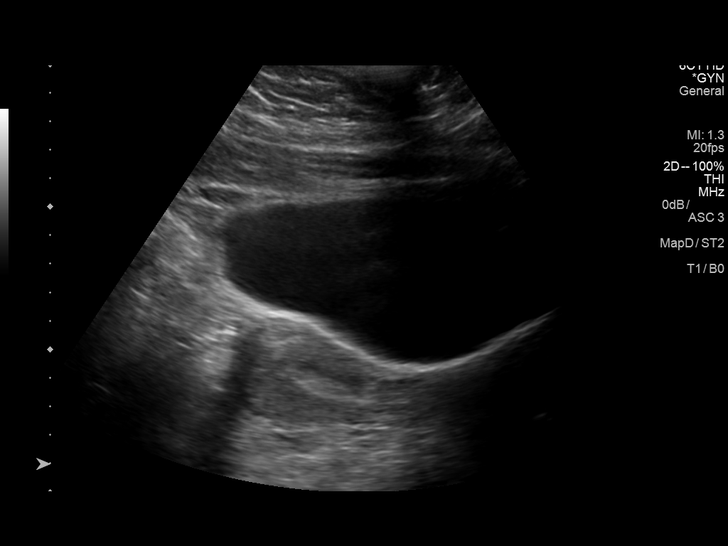
[im 6/72]
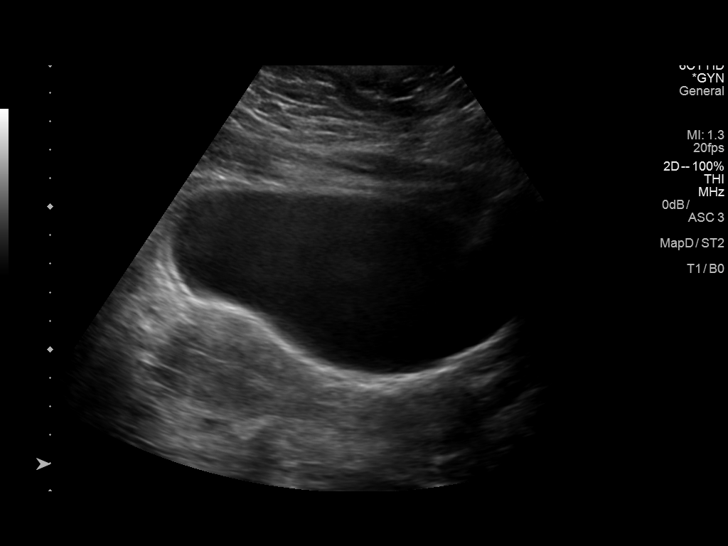
[im 12/72]
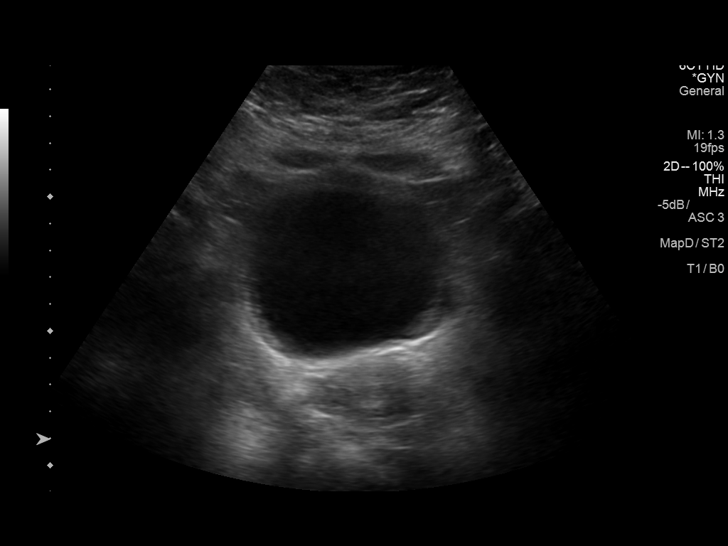
[im 18/72]
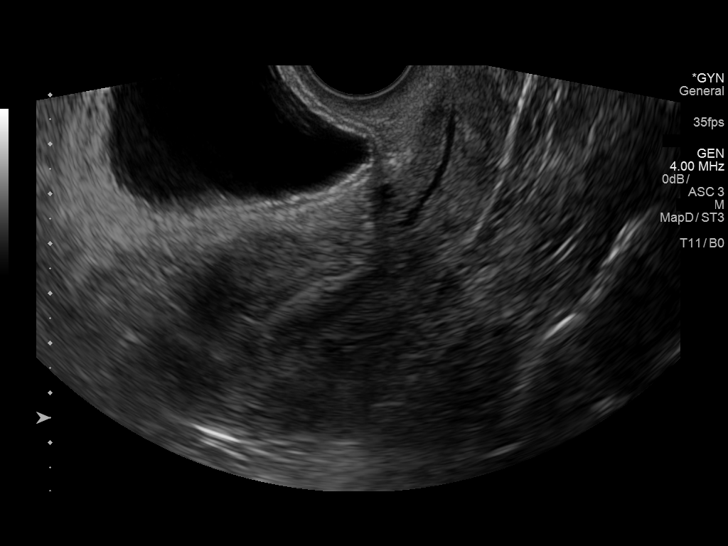
[im 24/72]
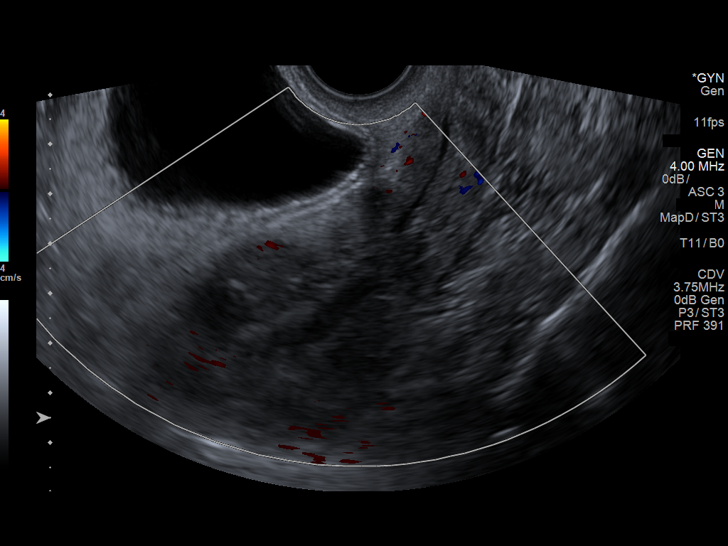
[im 27/72]
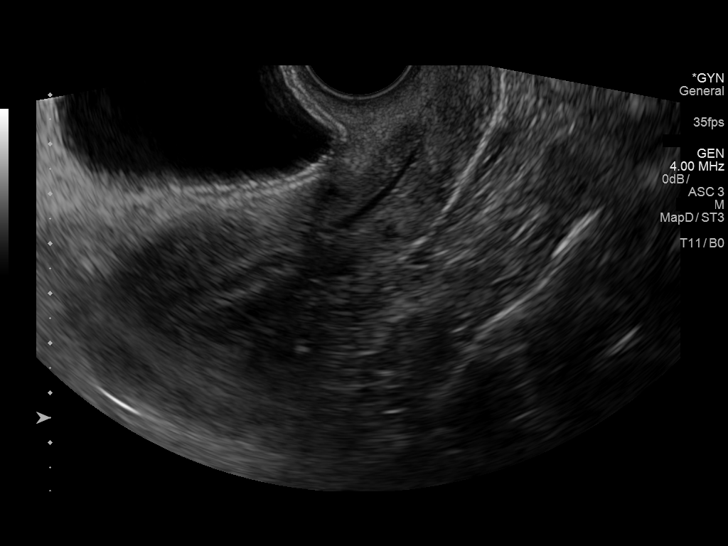
[im 33/72]
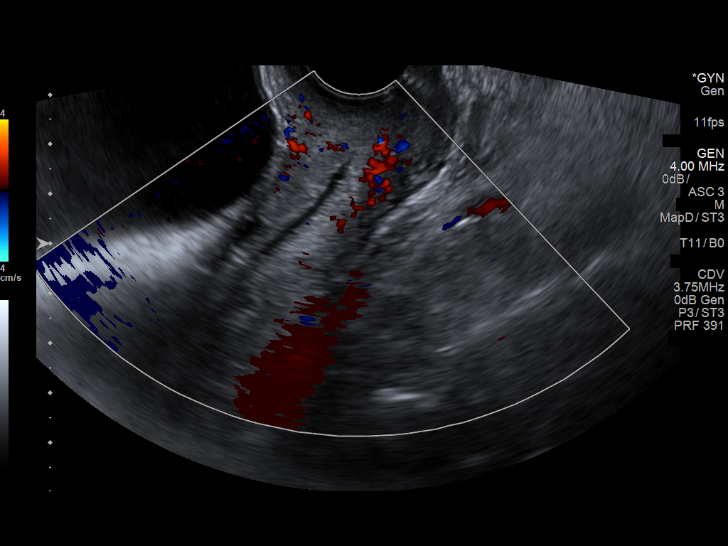
[im 39/72]
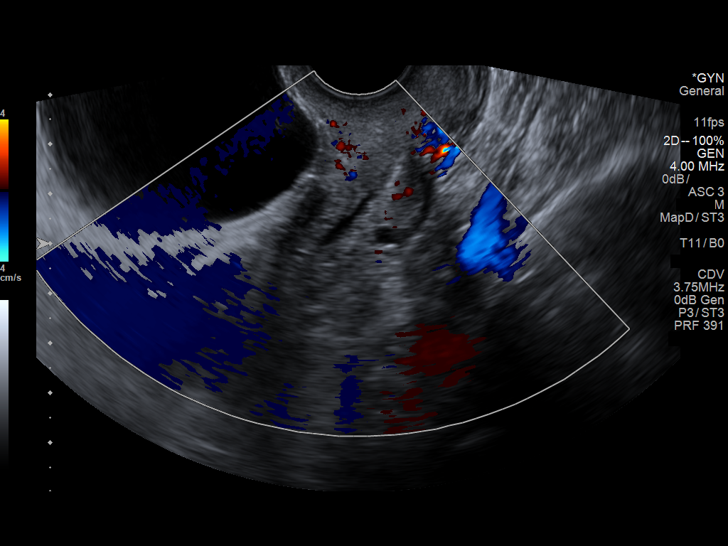
[im 45/72]
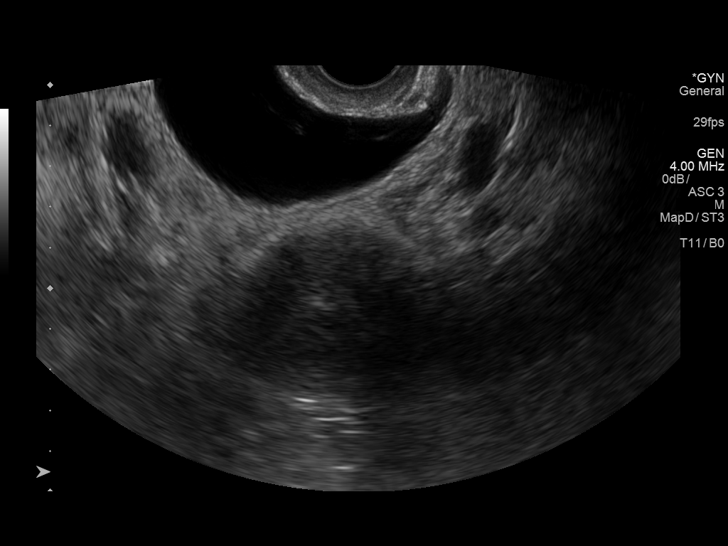
[im 48/72]
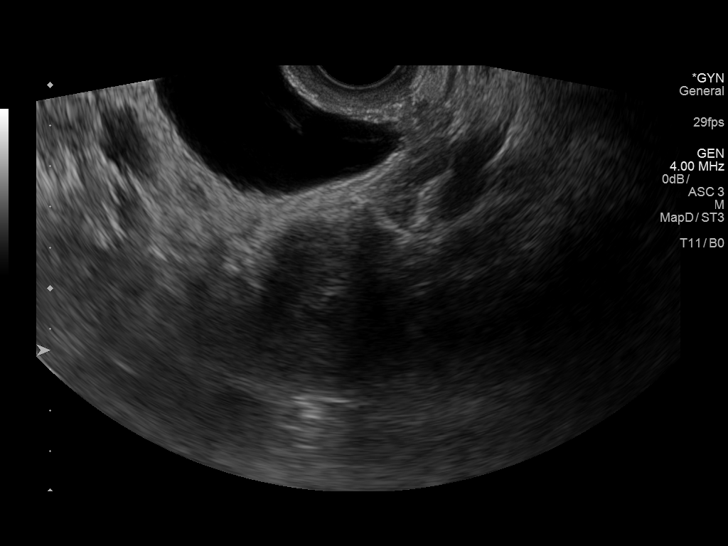
[im 54/72]
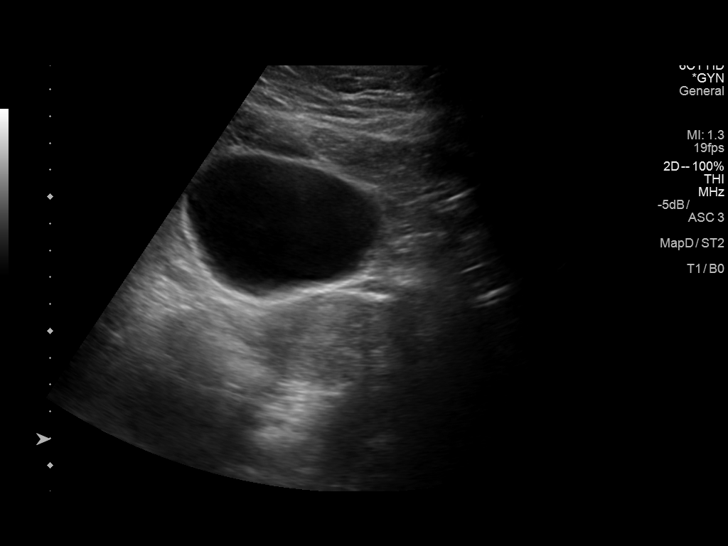
[im 60/72]
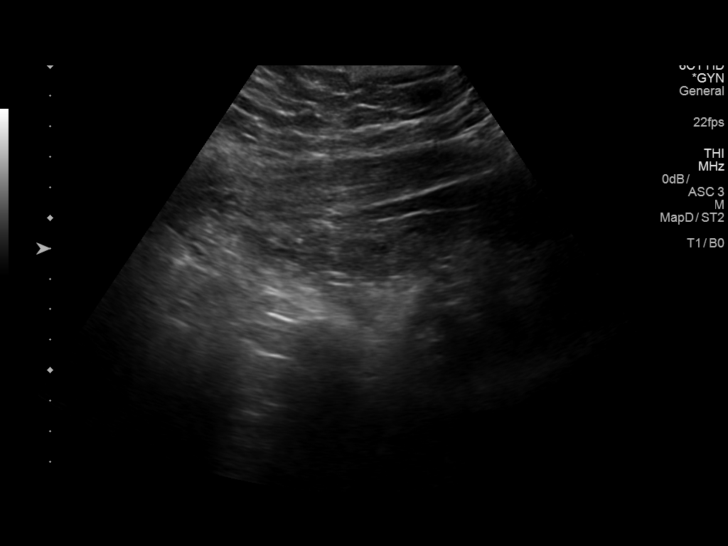
[im 66/72]
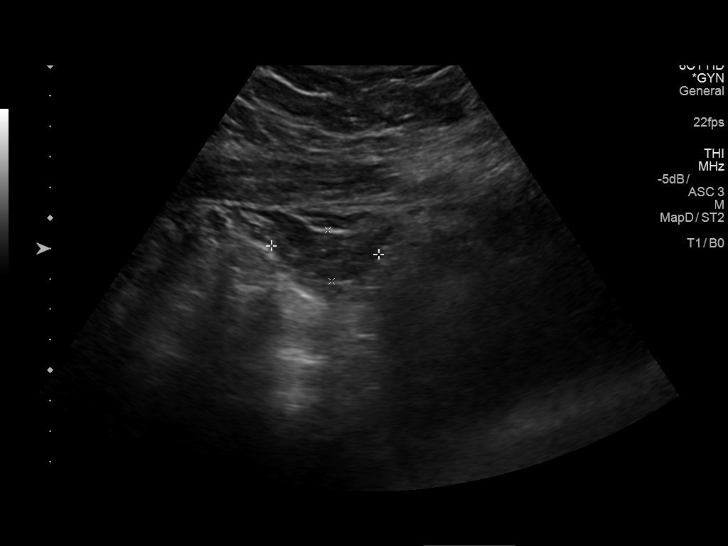
[im 72/72]
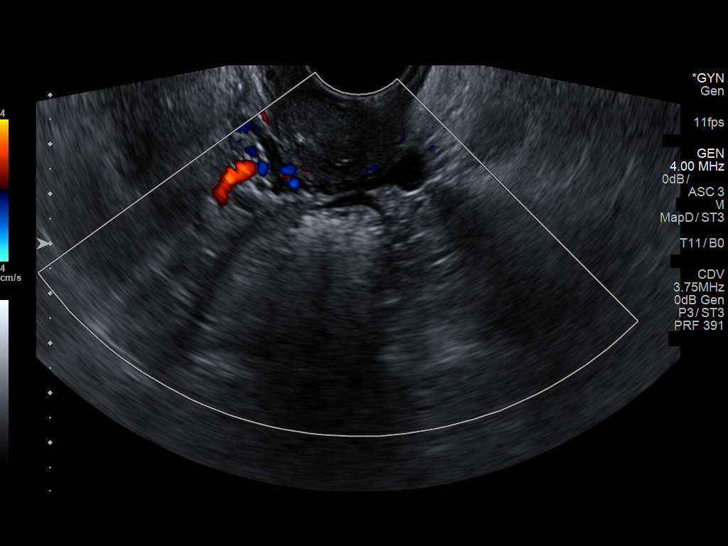

[14 of 25 positions shown; findings below may reference images not displayed]

FINDINGS: Technically suboptimal exam due to large patient habitus and
inability to lie still.

Uterus

Measurements: 10.4 x 4.7 x 5.7 cm. No fibroids or other mass
visualized.

Endometrium

Thickness: 8 mm.  No focal abnormality visualized.

Right ovary

Measurements: 3.5 x 1.7 x 2.8 cm. Normal appearance/no adnexal mass.

Left ovary

Measurements: 3.8 x 1.7 x 2.8 cm. Normal appearance/no adnexal mass.

Other findings

No abnormal free fluid.
IMPRESSION: Technically suboptimal due to patient's inability to lie still and
large habitus. No pelvic mass or other significant abnormality
identified.

## 2019-09-16 ENCOUNTER — Ambulatory Visit: Payer: Commercial Managed Care - PPO | Attending: Family

## 2019-09-16 DIAGNOSIS — Z20822 Contact with and (suspected) exposure to covid-19: Secondary | ICD-10-CM

## 2019-09-17 LAB — NOVEL CORONAVIRUS, NAA: SARS-CoV-2, NAA: NOT DETECTED

## 2019-10-30 ENCOUNTER — Other Ambulatory Visit: Payer: Commercial Managed Care - PPO

## 2019-10-31 ENCOUNTER — Ambulatory Visit: Payer: Commercial Managed Care - PPO | Attending: Internal Medicine

## 2019-10-31 DIAGNOSIS — Z20822 Contact with and (suspected) exposure to covid-19: Secondary | ICD-10-CM

## 2019-11-01 LAB — SARS-COV-2, NAA 2 DAY TAT

## 2019-11-01 LAB — NOVEL CORONAVIRUS, NAA: SARS-CoV-2, NAA: NOT DETECTED

## 2020-04-27 ENCOUNTER — Other Ambulatory Visit: Payer: Self-pay

## 2020-04-27 ENCOUNTER — Ambulatory Visit (HOSPITAL_COMMUNITY)
Admission: EM | Admit: 2020-04-27 | Discharge: 2020-04-27 | Disposition: A | Discharge status: Home / Self Care | Payer: Commercial Managed Care - PPO | Attending: Family Medicine | Admitting: Family Medicine

## 2020-04-27 DIAGNOSIS — Z01812 Encounter for preprocedural laboratory examination: Secondary | ICD-10-CM | POA: Diagnosis present

## 2020-04-27 DIAGNOSIS — Z20822 Contact with and (suspected) exposure to covid-19: Secondary | ICD-10-CM | POA: Diagnosis not present

## 2020-04-27 LAB — SARS CORONAVIRUS 2 (TAT 6-24 HRS): SARS Coronavirus 2: NEGATIVE

## 2020-04-27 NOTE — ED Triage Notes (Signed)
Pt requesting COVID testing 2/2 possible secondary exposure to COVID.  Pt denies URI sx, fever, n/v/d, or other c/o.

## 2022-09-07 ENCOUNTER — Ambulatory Visit: Payer: Commercial Managed Care - PPO | Admitting: Podiatry

## 2022-09-13 ENCOUNTER — Ambulatory Visit: Payer: Commercial Managed Care - PPO | Admitting: Podiatry

## 2022-09-14 ENCOUNTER — Ambulatory Visit: Payer: Commercial Managed Care - PPO | Admitting: Podiatry

## 2022-09-14 DIAGNOSIS — Q666 Other congenital valgus deformities of feet: Secondary | ICD-10-CM | POA: Diagnosis not present

## 2022-09-14 DIAGNOSIS — M7661 Achilles tendinitis, right leg: Secondary | ICD-10-CM | POA: Diagnosis not present

## 2022-09-14 DIAGNOSIS — F4320 Adjustment disorder, unspecified: Secondary | ICD-10-CM | POA: Insufficient documentation

## 2022-09-14 DIAGNOSIS — E119 Type 2 diabetes mellitus without complications: Secondary | ICD-10-CM | POA: Insufficient documentation

## 2022-09-14 DIAGNOSIS — F329 Major depressive disorder, single episode, unspecified: Secondary | ICD-10-CM | POA: Insufficient documentation

## 2022-09-14 DIAGNOSIS — F17200 Nicotine dependence, unspecified, uncomplicated: Secondary | ICD-10-CM | POA: Insufficient documentation

## 2022-09-14 DIAGNOSIS — N926 Irregular menstruation, unspecified: Secondary | ICD-10-CM | POA: Insufficient documentation

## 2022-09-14 MED ORDER — MELOXICAM 15 MG PO TABS: 15.0000 mg | ORAL_TABLET | Freq: Every day | ORAL | 0 refills | Status: AC

## 2022-09-14 MED ORDER — METHYLPREDNISOLONE 4 MG PO TBPK: ORAL_TABLET | ORAL | 0 refills | Status: AC

## 2022-09-14 NOTE — Progress Notes (Signed)
Subjective:  Patient ID: Michelle Pope, female    DOB: 1991/05/10,  MRN: [id number]  Chief Complaint  Patient presents with   Foot Pain    Pt stated that she has discomfort in both feet but the right one is a lot worse and she just wanted to have them looked at     32 y.o. female presents with the above complaint.  Patient presents with bilateral Achilles insertional pain.  Patient states is painful to touch.  The right side is worse than left side.  She wanted to get it evaluated she has not seen anyone as prior to seeing me.  She would like to discuss treatment options for this pain scale 7 out of 10 sharp shooting in nature hurts with ambulation hurts with pressure   Review of Systems: Negative except as noted in the HPI. Denies N/V/F/Ch.  Past Medical History:  Diagnosis Date   Asthma    HSV infection    Obesity     Current Outpatient Medications:    amoxicillin-clavulanate (AUGMENTIN) 875-125 MG tablet, , Disp: , Rfl:    sertraline (ZOLOFT) 50 MG tablet, 1 tablet Orally Once a day, Disp: , Rfl:    albuterol (PROVENTIL HFA;VENTOLIN HFA) 108 (90 Base) MCG/ACT inhaler, Inhale 1-2 puffs into the lungs every 6 (six) hours as needed for wheezing or shortness of breath. (Patient not taking: Reported on 06/20/2016), Disp: 1 Inhaler, Rfl: 0   ibuprofen (ADVIL,MOTRIN) 600 MG tablet, Take 1 tablet (600 mg total) by mouth every 8 (eight) hours as needed. Take with food., Disp: 20 tablet, Rfl: 0   medroxyPROGESTERone (DEPO-PROVERA) 150 MG/ML injection, Inject 1 mL (150 mg total) into the muscle every 3 (three) months. (Patient not taking: Reported on 04/09/2018), Disp: 1 mL, Rfl: 5  Current Facility-Administered Medications:    medroxyPROGESTERone (DEPO-PROVERA) injection 150 mg, 150 mg, Intramuscular, Q90 days, Shelly Bombard, MD, 150 mg at 12/08/17 1015  Social History   Tobacco Use  Smoking Status Every Day   Packs/day: 0.50   Types: Cigarettes  Smokeless Tobacco Never     No Known Allergies Objective:  There were no vitals filed for this visit. There is no height or weight on file to calculate BMI. Constitutional Well developed. Well nourished.  Vascular Dorsalis pedis pulses palpable bilaterally. Posterior tibial pulses palpable bilaterally. Capillary refill normal to all digits.  No cyanosis or clubbing noted. Pedal hair growth normal.  Neurologic Normal speech. Oriented to person, place, and time. Epicritic sensation to light touch grossly present bilaterally.  Dermatologic Nails well groomed and normal in appearance. No open wounds. No skin lesions.  Orthopedic: Pain on palpation to bilateral Achilles tendon insertion.  Pain with range of motion of the ankle joint pain with dorsiflexion of the ankle joint no pain with plantarflexion of the ankle joint no pain at the posterior tibial tendon peroneal tendon ATFL ligament positive Silfverskiold test with gastrocnemius equinus positive Haglund's deformity clinically appreciated   Radiographs: None Assessment:   1. Pes planovalgus   2. Achilles tendinitis, right leg    Plan:  Patient was evaluated and treated and all questions answered.  Bilateral Achilles insertional pain with right greater than left side -All questions and concerns were discussed with the patient in extensive detail given the amount of pain that she is experiencing she will benefit from shoe gear modification with orthotics management.  For now her pain is very manageable. -Meloxicam Medrol Dosepak was dispensed for pain control  Pes planovalgus -I explained  to patient the etiology of pes planovalgus and relationship with Achilles tendinitis and various treatment options were discussed.  Given patient foot structure in the setting of Planter fasciitis I believe patient will benefit from custom-made orthotics to help control the hindfoot motion support the arch of the foot and take the stress away from plantar fascial.  Patient  agrees with the plan like to proceed with orthotics -Patient was casted for orthotics with heel lift   No follow-ups on file.

## 2022-10-19 ENCOUNTER — Other Ambulatory Visit: Payer: Self-pay | Admitting: Podiatry

## 2022-10-19 DIAGNOSIS — Q666 Other congenital valgus deformities of feet: Secondary | ICD-10-CM

## 2023-04-15 ENCOUNTER — Emergency Department (HOSPITAL_BASED_OUTPATIENT_CLINIC_OR_DEPARTMENT_OTHER): Payer: Commercial Managed Care - PPO

## 2023-04-15 ENCOUNTER — Other Ambulatory Visit: Payer: Self-pay

## 2023-04-15 ENCOUNTER — Encounter (HOSPITAL_BASED_OUTPATIENT_CLINIC_OR_DEPARTMENT_OTHER): Payer: Self-pay | Admitting: Emergency Medicine

## 2023-04-15 ENCOUNTER — Emergency Department (HOSPITAL_BASED_OUTPATIENT_CLINIC_OR_DEPARTMENT_OTHER)
Admission: EM | Admit: 2023-04-15 | Discharge: 2023-04-15 | Disposition: A | Discharge status: Home / Self Care | Payer: Commercial Managed Care - PPO | Attending: Emergency Medicine | Admitting: Emergency Medicine

## 2023-04-15 DIAGNOSIS — R030 Elevated blood-pressure reading, without diagnosis of hypertension: Secondary | ICD-10-CM | POA: Diagnosis not present

## 2023-04-15 DIAGNOSIS — R0789 Other chest pain: Secondary | ICD-10-CM | POA: Insufficient documentation

## 2023-04-15 DIAGNOSIS — R079 Chest pain, unspecified: Secondary | ICD-10-CM

## 2023-04-15 LAB — CBC WITH DIFFERENTIAL/PLATELET
Abs Immature Granulocytes: 0.02 10*3/uL (ref 0.00–0.07)
Basophils Absolute: 0 10*3/uL (ref 0.0–0.1)
Basophils Relative: 0 %
Eosinophils Absolute: 0.1 10*3/uL (ref 0.0–0.5)
Eosinophils Relative: 2 %
HCT: 41.6 % (ref 36.0–46.0)
Hemoglobin: 13.1 g/dL (ref 12.0–15.0)
Immature Granulocytes: 0 %
Lymphocytes Relative: 31 %
Lymphs Abs: 2.1 10*3/uL (ref 0.7–4.0)
MCH: 27.2 pg (ref 26.0–34.0)
MCHC: 31.5 g/dL (ref 30.0–36.0)
MCV: 86.3 fL (ref 80.0–100.0)
Monocytes Absolute: 0.5 10*3/uL (ref 0.1–1.0)
Monocytes Relative: 7 %
Neutro Abs: 3.9 10*3/uL (ref 1.7–7.7)
Neutrophils Relative %: 60 %
Platelets: 236 10*3/uL (ref 150–400)
RBC: 4.82 MIL/uL (ref 3.87–5.11)
RDW: 14.1 % (ref 11.5–15.5)
WBC: 6.6 10*3/uL (ref 4.0–10.5)
nRBC: 0 % (ref 0.0–0.2)

## 2023-04-15 LAB — BASIC METABOLIC PANEL
Anion gap: 11 (ref 5–15)
BUN: 10 mg/dL (ref 6–20)
CO2: 23 mmol/L (ref 22–32)
Calcium: 8.8 mg/dL — ABNORMAL LOW (ref 8.9–10.3)
Chloride: 102 mmol/L (ref 98–111)
Creatinine, Ser: 0.68 mg/dL (ref 0.44–1.00)
GFR, Estimated: >60 mL/min (ref >60)
Glucose, Bld: 92 mg/dL (ref 70–99)
Potassium: 4 mmol/L (ref 3.5–5.1)
Sodium: 136 mmol/L (ref 135–145)

## 2023-04-15 LAB — PREGNANCY, URINE: Preg Test, Ur: NEGATIVE

## 2023-04-15 LAB — TROPONIN I (HIGH SENSITIVITY)
Troponin I (High Sensitivity): 4 ng/L (ref <18)
Troponin I (High Sensitivity): 4 ng/L (ref <18)

## 2023-04-15 NOTE — ED Triage Notes (Signed)
Pt reports she had a panic attack on Mon; she's had CP since then; reports she had another panic attack on Fri; feels like someone is pressing on her chest; denies other sxs

## 2023-04-15 NOTE — Discharge Instructions (Signed)
You were seen emergency department for chest pain.  You had blood work chest x-ray and EKG that showed no evidence of heart injury.  Your blood pressure was elevated also here.  This may be related to stress but it would be important for you to follow-up with primary care doctor for further evaluation and possible starting on medication.  Return to the emergency department if any worsening or concerning symptoms.

## 2023-04-15 NOTE — ED Provider Notes (Signed)
[place] [facility] Provider Note   CSN: [id number] Arrival date & time: 04/15/23  1546     History {Add pertinent medical, surgical, social history, OB history to HPI:1} Chief Complaint  Patient presents with   Chest Pain    Michelle Pope is a 32 y.o. female.  She has a history of borderline diabetes and blood pressure.  She said she has been under a lot of stress at work and on Monday she had she thought was a panic attack.  Felt her heart racing and trouble breathing, pain in her chest.  She has had chest pain on and off since then worsening again since last night and she thought she had another panic attack.  She went to urgent care today and they recommended she come here for further evaluation.  No known history of any cardiac disease.  Cocaine use.  The history is provided by the patient.  Chest Pain Pain location:  Substernal area Pain quality: pressure   Pain radiates to:  Does not radiate Pain severity:  Mild Onset quality:  Gradual Duration:  5 days Timing:  Intermittent Chronicity:  New Context: stress   Relieved by:  Nothing Ineffective treatments:  Rest Associated symptoms: palpitations and shortness of breath   Associated symptoms: no abdominal pain, no cough, no diaphoresis, no nausea and no vomiting        Home Medications Prior to Admission medications   Medication Sig Start Date End Date Taking? Authorizing Provider  albuterol (PROVENTIL HFA;VENTOLIN HFA) 108 (90 Base) MCG/ACT inhaler Inhale 1-2 puffs into the lungs every 6 (six) hours as needed for wheezing or shortness of breath. Patient not taking: Reported on 06/20/2016 03/13/16   Trixie Dredge, PA-C  amoxicillin-clavulanate (AUGMENTIN) [phone] MG tablet  07/09/16   [provider]  ibuprofen (ADVIL,MOTRIN) 600 MG tablet Take 1 tablet (600 mg total) by mouth every 8 (eight) hours as needed. Take with food. 11/07/17   Cathren Laine, MD   medroxyPROGESTERone (DEPO-PROVERA) 150 MG/ML injection Inject 1 mL (150 mg total) into the muscle every 3 (three) months. Patient not taking: Reported on 04/09/2018 08/31/17   Constant, Peggy, MD  meloxicam (MOBIC) 15 MG tablet Take 1 tablet (15 mg total) by mouth daily. 09/14/22   Candelaria Stagers, DPM  methylPREDNISolone (MEDROL DOSEPAK) 4 MG TBPK tablet Take as directed 09/14/22   Candelaria Stagers, DPM  sertraline (ZOLOFT) 50 MG tablet 1 tablet Orally Once a day 12/30/19   [provider]      Allergies    Patient has no known allergies.    Review of Systems   Review of Systems  Constitutional:  Negative for diaphoresis.  Eyes:  Negative for visual disturbance.  Respiratory:  Positive for shortness of breath. Negative for cough.   Cardiovascular:  Positive for chest pain and palpitations.  Gastrointestinal:  Negative for abdominal pain, nausea and vomiting.    Physical Exam Updated Vital Signs BP (!) 151/97   Pulse 65   Temp 98.3 F (36.8 C) (Oral)   Resp 20   Ht 5\' 2"  (1.575 m)   Wt (!) 140.6 kg   SpO2 100%   BMI 56.70 kg/m  Physical Exam Vitals and nursing note reviewed.  Constitutional:      General: She is not in acute distress.    Appearance: She is well-developed.  HENT:     Head: Normocephalic and atraumatic.  Eyes:     Conjunctiva/sclera: Conjunctivae normal.  Cardiovascular:  Rate and Rhythm: Normal rate and regular rhythm.     Heart sounds: Normal heart sounds. No murmur heard. Pulmonary:     Effort: Pulmonary effort is normal. No respiratory distress.     Breath sounds: Normal breath sounds.  Abdominal:     Palpations: Abdomen is soft.     Tenderness: There is no abdominal tenderness.  Musculoskeletal:        General: No swelling.     Cervical back: Neck supple.     Right lower leg: No tenderness. No edema.     Left lower leg: No tenderness. No edema.  Skin:    General: Skin is warm and dry.     Capillary Refill: Capillary refill takes less  than 2 seconds.  Neurological:     General: No focal deficit present.     Mental Status: She is alert.     ED Results / Procedures / Treatments   Labs (all labs ordered are listed, but only abnormal results are displayed) Labs Reviewed  BASIC METABOLIC PANEL - Abnormal; Notable for the following components:      Result Value   Calcium 8.8 (*)    All other components within normal limits  PREGNANCY, URINE  CBC WITH DIFFERENTIAL/PLATELET  TROPONIN I (HIGH SENSITIVITY)  TROPONIN I (HIGH SENSITIVITY)    EKG EKG Interpretation Date/Time:  Saturday April 15 2023 15:54:34 EDT Ventricular Rate:  72 PR Interval:  109 QRS Duration:  87 QT Interval:  376 QTC Calculation: 412 R Axis:   69  Text Interpretation: Sinus rhythm Short PR interval No old tracing to compare Confirmed by Meridee Score [phone]) on 04/15/2023 3:59:25 PM  Radiology DG Chest 2 View  Result Date: 04/15/2023 CLINICAL DATA:  Chest pain for 5 days.  Panic attacks. EXAM: CHEST - 2 VIEW COMPARISON:  08/06/2016 FINDINGS: The heart size and mediastinal contours are within normal limits. Both lungs are clear. The visualized skeletal structures are unremarkable. IMPRESSION: No active cardiopulmonary disease. Electronically Signed   By: Danae Orleans M.D.   On: 04/15/2023 17:38    Procedures Procedures  {Document cardiac monitor, telemetry assessment procedure when appropriate:1}  Medications Ordered in ED Medications - No data to display  ED Course/ Medical Decision Making/ A&P   {   Click here for ABCD2, HEART and other calculatorsREFRESH Note before signing :1}                              Medical Decision Making Amount and/or Complexity of Data Reviewed Labs: ordered. Radiology: ordered.   This patient complains of ***; this involves an extensive number of treatment Options and is a complaint that carries with it a high risk of complications and morbidity. The differential includes ***  I ordered,  reviewed and interpreted labs, which included *** I ordered medication *** and reviewed PMP when indicated. I ordered imaging studies which included *** and I independently    visualized and interpreted imaging which showed *** Additional history obtained from *** Previous records obtained and reviewed *** I consulted *** and discussed lab and imaging findings and discussed disposition.  Cardiac monitoring reviewed, *** Social determinants considered, *** Critical Interventions: ***  After the interventions stated above, I reevaluated the patient and found *** Admission and further testing considered, ***   {Document critical care time when appropriate:1} {Document review of labs and clinical decision tools ie heart score, Chads2Vasc2 etc:1}  {Document your independent review of radiology  images, and any outside records:1} {Document your discussion with family members, caretakers, and with consultants:1} {Document social determinants of health affecting pt's care:1} {Document your decision making why or why not admission, treatments were needed:1} Final Clinical Impression(s) / ED Diagnoses Final diagnoses:  None    Rx / DC Orders ED Discharge Orders     None

## 2023-09-13 ENCOUNTER — Emergency Department (HOSPITAL_BASED_OUTPATIENT_CLINIC_OR_DEPARTMENT_OTHER)

## 2023-09-13 ENCOUNTER — Emergency Department (HOSPITAL_BASED_OUTPATIENT_CLINIC_OR_DEPARTMENT_OTHER)
Admission: EM | Admit: 2023-09-13 | Discharge: 2023-09-13 | Disposition: A | Discharge status: Home / Self Care | Attending: Emergency Medicine | Admitting: Emergency Medicine

## 2023-09-13 ENCOUNTER — Other Ambulatory Visit: Payer: Self-pay

## 2023-09-13 ENCOUNTER — Encounter (HOSPITAL_BASED_OUTPATIENT_CLINIC_OR_DEPARTMENT_OTHER): Payer: Self-pay | Admitting: Emergency Medicine

## 2023-09-13 DIAGNOSIS — M79603 Pain in arm, unspecified: Secondary | ICD-10-CM | POA: Diagnosis present

## 2023-09-13 DIAGNOSIS — G5621 Lesion of ulnar nerve, right upper limb: Secondary | ICD-10-CM | POA: Insufficient documentation

## 2023-09-13 DIAGNOSIS — R051 Acute cough: Secondary | ICD-10-CM | POA: Diagnosis not present

## 2023-09-13 DIAGNOSIS — Z79899 Other long term (current) drug therapy: Secondary | ICD-10-CM | POA: Insufficient documentation

## 2023-09-13 LAB — PREGNANCY, URINE: Preg Test, Ur: NEGATIVE

## 2023-09-13 MED ORDER — KETOROLAC TROMETHAMINE 60 MG/2ML IM SOLN
60.0000 mg | Freq: Once | INTRAMUSCULAR | Status: AC
  Administered 2023-09-13: 60 mg via INTRAMUSCULAR
  Filled 2023-09-13: qty 2

## 2023-09-13 MED ORDER — IBUPROFEN 800 MG PO TABS: 800.0000 mg | ORAL_TABLET | Freq: Three times a day (TID) | ORAL | 0 refills | Status: AC | PRN

## 2023-09-13 MED ORDER — BENZONATATE 100 MG PO CAPS: 100.0000 mg | ORAL_CAPSULE | Freq: Three times a day (TID) | ORAL | 0 refills | Status: AC | PRN

## 2023-09-13 NOTE — ED Provider Notes (Signed)
 [place] [facility] Provider Note   CSN: [id number] Arrival date & time: 09/13/23  0346     History  Chief Complaint  Patient presents with   Arm Pain   URI    Michelle Pope is a 33 y.o. female.  The history is provided by the patient.  Arm Pain  URI Michelle Pope is a 34 y.o. female who presents to the Emergency Department complaining of arm pain.  She presents to the emergency department for evaluation of atraumatic right arm pain that started at midnight.  She states that it started right before she went to bed and has been having difficulty sleeping secondary to the pain.  Pain is located predominantly in her right forearm and radiates to her right fourth and fifth digit and up to her upper arm.  No numbness, weakness.  She also states that last Wednesday she did have a fever.  She has associated runny nose.  On Saturday she developed a cough that is nonproductive.  No associate chest pain, difficulty breathing, abdominal pain, nausea, vomiting, leg swelling or pain.  She has no known medical problems and takes no routine medications.  She is right-hand dominant.      Home Medications Prior to Admission medications   Medication Sig Start Date End Date Taking? Authorizing Provider  benzonatate (TESSALON) 100 MG capsule Take 1 capsule (100 mg total) by mouth 3 (three) times daily as needed. 09/13/23  Yes Tilden Fossa, MD  ibuprofen (ADVIL) 800 MG tablet Take 1 tablet (800 mg total) by mouth every 8 (eight) hours as needed. 09/13/23  Yes Tilden Fossa, MD  albuterol (PROVENTIL HFA;VENTOLIN HFA) 108 (90 Base) MCG/ACT inhaler Inhale 1-2 puffs into the lungs every 6 (six) hours as needed for wheezing or shortness of breath. Patient not taking: Reported on 06/20/2016 03/13/16   Trixie Dredge, PA-C  amoxicillin-clavulanate (AUGMENTIN) [phone] MG tablet  07/09/16   [provider]  medroxyPROGESTERone (DEPO-PROVERA) 150 MG/ML  injection Inject 1 mL (150 mg total) into the muscle every 3 (three) months. Patient not taking: Reported on 04/09/2018 08/31/17   Constant, Peggy, MD  meloxicam (MOBIC) 15 MG tablet Take 1 tablet (15 mg total) by mouth daily. 09/14/22   Candelaria Stagers, DPM  methylPREDNISolone (MEDROL DOSEPAK) 4 MG TBPK tablet Take as directed 09/14/22   Candelaria Stagers, DPM  sertraline (ZOLOFT) 50 MG tablet 1 tablet Orally Once a day 12/30/19   [provider]      Allergies    Patient has no known allergies.    Review of Systems   Review of Systems  All other systems reviewed and are negative.   Physical Exam Updated Vital Signs BP 133/71   Pulse 82   Temp 98.8 F (37.1 C) (Oral)   Resp 18   Ht 5\' 2"  (1.575 m)   Wt (!) 139.3 kg   LMP  (LMP Unknown)   SpO2 98%   BMI 56.15 kg/m  Physical Exam Vitals and nursing note reviewed.  Constitutional:      Appearance: She is well-developed.  HENT:     Head: Normocephalic and atraumatic.  Cardiovascular:     Rate and Rhythm: Normal rate and regular rhythm.     Heart sounds: No murmur heard. Pulmonary:     Effort: Pulmonary effort is normal. No respiratory distress.     Breath sounds: Normal breath sounds.  Abdominal:     Palpations: Abdomen is soft.  Tenderness: There is no abdominal tenderness. There is no guarding or rebound.  Musculoskeletal:        General: No swelling or tenderness.     Comments: 2+ radial pulses bilaterally.  There is no tenderness to palpation throughout the neck, right upper extremity.  She is able to fully range her neck, shoulder, elbow, wrist.  Skin:    General: Skin is warm and dry.  Neurological:     Mental Status: She is alert and oriented to person, place, and time.     Comments: 5 out of 5 strength in bilateral upper extremities in proximal and distal muscle groups.  Sensation light touch intact in bilateral upper extremities.  Psychiatric:        Behavior: Behavior normal.     ED Results / Procedures  / Treatments   Labs (all labs ordered are listed, but only abnormal results are displayed) Labs Reviewed  PREGNANCY, URINE    EKG None  Radiology DG Chest 2 View Result Date: 09/13/2023 CLINICAL DATA:  Productive cough with arm pain. EXAM: CHEST - 2 VIEW COMPARISON:  PA and lateral chest 04/15/2023. FINDINGS: The heart size and mediastinal contours are within normal limits. Both lungs are clear. The visualized skeletal structures are unremarkable. IMPRESSION: No active cardiopulmonary disease.  Stable chest. Electronically Signed   By: Almira Bar M.D.   On: 09/13/2023 05:42    Procedures Procedures    Medications Ordered in ED Medications  ketorolac (TORADOL) injection 60 mg (60 mg Intramuscular Given 09/13/23 0444)    ED Course/ Medical Decision Making/ A&P                                 Medical Decision Making Amount and/or Complexity of Data Reviewed Labs: ordered. Radiology: ordered.  Risk Prescription drug management.   Patient here for evaluation of right arm pain, cough.  In terms of her arm pain, this is an the distribution of the ulnar nerve.  She does not have any deficits on examination.  No evidence of soft tissue infection.  She is able to fully range the arm without reproduction of her pain.  After ketorolac her pain is improved.  Her second complaint is of cough with associated runny nose, had a fever 1 week ago.  Lungs are clear on examination with no respiratory distress.  Current clinical picture is not consistent with PE, pneumonia, acute CHF.  Discussed with patient home care for radiculopathy as well as cough.  Discussed possible viral URI.  Discussed outpatient follow-up as well as return precautions.        Final Clinical Impression(s) / ED Diagnoses Final diagnoses:  Ulnar neuropathy of right upper extremity  Acute cough    Rx / DC Orders ED Discharge Orders          Ordered    benzonatate (TESSALON) 100 MG capsule  3 times daily PRN         09/13/23 0558    ibuprofen (ADVIL) 800 MG tablet  Every 8 hours PRN        09/13/23 0558              Tilden Fossa, MD 09/13/23 [phone]

## 2023-09-13 NOTE — ED Triage Notes (Addendum)
 Pain to right forearm radiates to hand and up arm. Started tonight before bed. Denies Hx or injury. Pt also request eval for URI started on sat.
